# Patient Record
Sex: Female | Born: 1965 | Race: White | Hispanic: No | Marital: Married | Smoking: Never smoker
Health system: Southern US, Community
[De-identification: ages and names within clinical notes are randomized; demographics above are authoritative.]

## PROBLEM LIST (undated history)

## (undated) DIAGNOSIS — R519 Headache, unspecified: Secondary | ICD-10-CM

## (undated) DIAGNOSIS — T7840XA Allergy, unspecified, initial encounter: Secondary | ICD-10-CM

## (undated) DIAGNOSIS — F32A Depression, unspecified: Secondary | ICD-10-CM

## (undated) DIAGNOSIS — F419 Anxiety disorder, unspecified: Secondary | ICD-10-CM

## (undated) DIAGNOSIS — M79671 Pain in right foot: Secondary | ICD-10-CM

## (undated) DIAGNOSIS — H547 Unspecified visual loss: Secondary | ICD-10-CM

## (undated) DIAGNOSIS — M1990 Unspecified osteoarthritis, unspecified site: Secondary | ICD-10-CM

## (undated) DIAGNOSIS — R51 Headache: Secondary | ICD-10-CM

## (undated) DIAGNOSIS — J349 Unspecified disorder of nose and nasal sinuses: Secondary | ICD-10-CM

## (undated) DIAGNOSIS — F329 Major depressive disorder, single episode, unspecified: Secondary | ICD-10-CM

## (undated) DIAGNOSIS — K219 Gastro-esophageal reflux disease without esophagitis: Secondary | ICD-10-CM

## (undated) DIAGNOSIS — N189 Chronic kidney disease, unspecified: Secondary | ICD-10-CM

## (undated) DIAGNOSIS — E079 Disorder of thyroid, unspecified: Secondary | ICD-10-CM

## (undated) DIAGNOSIS — R7303 Prediabetes: Secondary | ICD-10-CM

## (undated) HISTORY — DX: Chronic kidney disease, unspecified: N18.9

## (undated) HISTORY — PX: TONSILLECTOMY AND ADENOIDECTOMY: SUR1326

## (undated) HISTORY — DX: Headache, unspecified: R51.9

## (undated) HISTORY — DX: Unspecified visual loss: H54.7

## (undated) HISTORY — DX: Unspecified disorder of nose and nasal sinuses: J34.9

## (undated) HISTORY — DX: Disorder of thyroid, unspecified: E07.9

## (undated) HISTORY — DX: Depression, unspecified: F32.A

## (undated) HISTORY — DX: Gastro-esophageal reflux disease without esophagitis: K21.9

## (undated) HISTORY — DX: Major depressive disorder, single episode, unspecified: F32.9

## (undated) HISTORY — DX: Anxiety disorder, unspecified: F41.9

## (undated) HISTORY — DX: Prediabetes: R73.03

## (undated) HISTORY — DX: Pain in right foot: M79.671

## (undated) HISTORY — DX: Allergy, unspecified, initial encounter: T78.40XA

## (undated) HISTORY — DX: Headache: R51

## (undated) HISTORY — DX: Unspecified osteoarthritis, unspecified site: M19.90

---

## 1998-11-16 HISTORY — PX: OTHER SURGICAL HISTORY: SHX169

## 2015-01-04 LAB — HEMOGLOBIN A1C: HEMOGLOBIN A1C: 5.6

## 2015-01-04 LAB — TSH: TSH: 0.01 — AB (ref 0.41–5.90)

## 2015-01-04 LAB — BASIC METABOLIC PANEL
GLUCOSE: 89
POTASSIUM: 4.5 (ref 3.4–5.3)
SODIUM: 142 (ref 137–147)

## 2015-01-04 LAB — VITAMIN D 25 HYDROXY (VIT D DEFICIENCY, FRACTURES): Vit D, 25-Hydroxy: 44

## 2015-01-04 LAB — HEPATIC FUNCTION PANEL
ALK PHOS: 32 (ref 25–125)
AST: 30 (ref 13–35)

## 2017-08-16 ENCOUNTER — Ambulatory Visit (INDEPENDENT_AMBULATORY_CARE_PROVIDER_SITE_OTHER): Payer: 59 | Admitting: Family Medicine

## 2017-08-16 ENCOUNTER — Encounter: Payer: Self-pay | Admitting: Family Medicine

## 2017-08-16 VITALS — BP 110/74 | HR 56 | Temp 98.1°F | Wt 177.6 lb

## 2017-08-16 DIAGNOSIS — E559 Vitamin D deficiency, unspecified: Secondary | ICD-10-CM | POA: Diagnosis not present

## 2017-08-16 DIAGNOSIS — R5382 Chronic fatigue, unspecified: Secondary | ICD-10-CM

## 2017-08-16 DIAGNOSIS — Z87898 Personal history of other specified conditions: Secondary | ICD-10-CM | POA: Diagnosis not present

## 2017-08-16 DIAGNOSIS — E039 Hypothyroidism, unspecified: Secondary | ICD-10-CM | POA: Insufficient documentation

## 2017-08-16 DIAGNOSIS — E538 Deficiency of other specified B group vitamins: Secondary | ICD-10-CM

## 2017-08-16 LAB — COMPREHENSIVE METABOLIC PANEL
ALT: 13 U/L (ref 0–35)
AST: 17 U/L (ref 0–37)
Albumin: 4.1 g/dL (ref 3.5–5.2)
Alkaline Phosphatase: 82 U/L (ref 39–117)
BUN: 11 mg/dL (ref 6–23)
CO2: 28 mEq/L (ref 19–32)
Calcium: 9.7 mg/dL (ref 8.4–10.5)
Chloride: 107 mEq/L (ref 96–112)
Creatinine, Ser: 0.93 mg/dL (ref 0.40–1.20)
GFR: 67.66 mL/min (ref >60.00)
Glucose, Bld: 100 mg/dL — ABNORMAL HIGH (ref 70–99)
Potassium: 4.4 mEq/L (ref 3.5–5.1)
Sodium: 145 mEq/L (ref 135–145)
Total Bilirubin: 0.4 mg/dL (ref 0.2–1.2)
Total Protein: 6.5 g/dL (ref 6.0–8.3)

## 2017-08-16 LAB — VITAMIN B12: Vitamin B-12: 536 pg/mL (ref 211–911)

## 2017-08-16 LAB — CBC WITH DIFFERENTIAL/PLATELET
Basophils Absolute: 0 10*3/uL (ref 0.0–0.1)
Basophils Relative: 0.8 % (ref 0.0–3.0)
Eosinophils Absolute: 0.2 10*3/uL (ref 0.0–0.7)
Eosinophils Relative: 3.7 % (ref 0.0–5.0)
HCT: 41 % (ref 36.0–46.0)
Hemoglobin: 13.4 g/dL (ref 12.0–15.0)
Lymphocytes Relative: 44.7 % (ref 12.0–46.0)
Lymphs Abs: 2.1 10*3/uL (ref 0.7–4.0)
MCHC: 32.6 g/dL (ref 30.0–36.0)
MCV: 94.1 fl (ref 78.0–100.0)
Monocytes Absolute: 0.4 10*3/uL (ref 0.1–1.0)
Monocytes Relative: 7.9 % (ref 3.0–12.0)
Neutro Abs: 2 10*3/uL (ref 1.4–7.7)
Neutrophils Relative %: 42.9 % — ABNORMAL LOW (ref 43.0–77.0)
Platelets: 289 10*3/uL (ref 150.0–400.0)
RBC: 4.36 Mil/uL (ref 3.87–5.11)
RDW: 13.5 % (ref 11.5–15.5)
WBC: 4.6 10*3/uL (ref 4.0–10.5)

## 2017-08-16 LAB — LIPID PANEL
Cholesterol: 205 mg/dL — ABNORMAL HIGH (ref 0–200)
HDL: 72.1 mg/dL (ref >39.00)
LDL Cholesterol: 118 mg/dL — ABNORMAL HIGH (ref 0–99)
NonHDL: 133.28
Total CHOL/HDL Ratio: 3
Triglycerides: 77 mg/dL (ref 0.0–149.0)
VLDL: 15.4 mg/dL (ref 0.0–40.0)

## 2017-08-16 LAB — FOLLICLE STIMULATING HORMONE: FSH: 78.8 m[IU]/mL

## 2017-08-16 LAB — VITAMIN D 25 HYDROXY (VIT D DEFICIENCY, FRACTURES): VITD: 42.99 ng/mL (ref 30.00–100.00)

## 2017-08-16 LAB — T4, FREE: Free T4: 1.27 ng/dL (ref 0.60–1.60)

## 2017-08-16 LAB — HEMOGLOBIN A1C: Hgb A1c MFr Bld: 5.9 % (ref 4.6–6.5)

## 2017-08-16 LAB — MAGNESIUM: Magnesium: 2.1 mg/dL (ref 1.5–2.5)

## 2017-08-16 LAB — LUTEINIZING HORMONE: LH: 39.35 m[IU]/mL

## 2017-08-16 LAB — TSH: TSH: 0.28 u[IU]/mL — ABNORMAL LOW (ref 0.35–4.50)

## 2017-08-16 NOTE — Progress Notes (Signed)
Caitlyn Page is a 51 y.o. female is here to [place].   Patient Care Team: Briscoe Deutscher, DO as PCP - General (Family Medicine)   History of Present Illness:   [name] CMA acting as scribe for Dr. Juleen China.  HPI:   1. Chronic fatigue. High stress job. Admits to "anxiety and depression tendencies." Does not exercise regularly. Recently started going to [facility] and got Health Coach. Wonders if she is perimenopausal.    2. Acquired hypothyroidism. Endocrine ROS: negative for - hair pattern changes, hot flashes, malaise/lethargy, mood swings, palpitations, skin changes, temperature intolerance or unexpected weight changes.    3. History of prediabetes. Endocrine ROS: negative for - polydipsia/polyuria.    4. Vitamin D deficiency. Taking vitamin D daily.    5. LE Pain: Hx of bilateral LE pain and numbness, worse after exercise.    Health Maintenance Due  Topic Date Due  . HIV Screening  01/07/1982  . TETANUS/TDAP  01/07/1986  . PAP SMEAR  01/08/1988  . MAMMOGRAM  01/07/2017  . COLONOSCOPY  01/07/2017   Depression screen PHQ 2/9 08/16/2017  Decreased Interest 1  Down, Depressed, Hopeless 1  PHQ - 2 Score 2  Altered sleeping 3  Tired, decreased energy 1  Change in appetite 1  Feeling bad or failure about yourself  1  Trouble concentrating 3  Moving slowly or fidgety/restless 2  Suicidal thoughts 0  PHQ-9 Score 13  Difficult doing work/chores Very difficult   PMHx, SurgHx, SocialHx, Medications, and Allergies were reviewed in the Visit Navigator and updated as appropriate.   Past Medical History:  Diagnosis Date  . Chronic kidney disease   . Depression   . Frequent headaches   . Thyroid disease     Past Surgical History:  Procedure Laterality Date  . TONSILLECTOMY AND ADENOIDECTOMY     Sometime in the 1980's   No family history on file.   Social History  Substance Use Topics  . Smoking status: Never Smoker  . Smokeless tobacco: Never Used    . Alcohol use No   Current Medications and Allergies:   Current Outpatient Prescriptions:  .  SYNTHROID 75 MCG tablet, Take 75 mcg by mouth daily., Disp: , Rfl: 0 .  Vitamin D, Ergocalciferol, (DRISDOL) 50000 units CAPS capsule, TAKE ONE CAPSULE BY MOUTH 2 TIMES A WEEK, Disp: , Rfl: 2  No Known Allergies   Review of Systems:   Pertinent items are noted in the HPI. Otherwise, ROS is negative.  Vitals:   Vitals:   08/16/17 0802  BP: 110/74  Pulse: (!) 56  Temp: 98.1 F (36.7 C)  TempSrc: Oral  SpO2: 98%  Weight: 177 lb 9.6 oz (80.6 kg)     There is no height or weight on file to calculate BMI.   Physical Exam:   Physical Exam  Constitutional: She is oriented to person, place, and time. She appears well-developed and well-nourished. No distress.  HENT:  Head: Normocephalic and atraumatic.  Right Ear: External ear normal.  Left Ear: External ear normal.  Nose: Nose normal.  Mouth/Throat: Oropharynx is clear and moist.  Eyes: Pupils are equal, round, and reactive to light. Conjunctivae and EOM are normal.  Neck: Normal range of motion. Neck supple. No thyromegaly present.  Cardiovascular: Normal rate, regular rhythm, normal heart sounds and intact distal pulses.   Pulmonary/Chest: Effort normal and breath sounds normal.  Abdominal: Soft. Bowel sounds are normal.  Musculoskeletal: Normal range of motion.  Lymphadenopathy:  She has no cervical adenopathy.  Neurological: She is alert and oriented to person, place, and time.  Skin: Skin is warm and dry. Capillary refill takes less than 2 seconds.  Psychiatric: She has a normal mood and affect. Her behavior is normal.  Nursing note and vitals reviewed.  Assessment and Plan:   Diagnoses and all orders for this visit:  Chronic fatigue -     CBC with Differential/Platelet -     Comprehensive metabolic panel -     T4, free -     TSH -     Hemoglobin A1c -     Magnesium -     Lipid panel -     Vitamin B12 -      VITAMIN D 25 Hydroxy (Vit-D Deficiency, Fractures) -     FSH -     LH -     T3, reverse  Acquired hypothyroidism -     T4, free -     TSH -     T3, reverse  History of prediabetes -     Comprehensive metabolic panel -     Hemoglobin A1c  Vitamin D deficiency -     VITAMIN D 25 Hydroxy (Vit-D Deficiency, Fractures)  B12 deficiency -     Vitamin B12   . Reviewed expectations re: course of current medical issues. . Discussed self-management of symptoms. . Outlined signs and symptoms indicating need for more acute intervention. . Patient verbalized understanding and all questions were answered. Marland Kitchen Health Maintenance issues including appropriate healthy diet, exercise, and smoking avoidance were discussed with patient. . See orders for this visit as documented in the electronic medical record. . Patient received an After Visit Summary.  CMA served as Education administrator during this visit. History, Physical, and Plan performed by medical provider. The above documentation has been reviewed and is accurate and complete. Briscoe Deutscher, D.O.  Briscoe Deutscher, DO [place] 08/16/2017  Future Appointments Date Time Provider [place]  11/22/2017 7:30 AM Briscoe Deutscher, DO [facility] None

## 2017-08-20 ENCOUNTER — Other Ambulatory Visit: Payer: Self-pay | Admitting: Family Medicine

## 2017-08-20 DIAGNOSIS — Z1231 Encounter for screening mammogram for malignant neoplasm of breast: Secondary | ICD-10-CM

## 2017-08-20 LAB — T3, REVERSE: T3, Reverse: 17 ng/dL (ref 8–25)

## 2017-08-20 MED ORDER — SYNTHROID 75 MCG PO TABS: 75.0000 ug | ORAL_TABLET | Freq: Every day | ORAL | 2 refills | Status: DC

## 2017-08-20 NOTE — Telephone Encounter (Signed)
Rx sent to requested pharmacy

## 2017-08-20 NOTE — Telephone Encounter (Signed)
MEDICATION:   SYNTHROID 75 MCG tablet    PHARMACY:   [id number] - [place], [place] (Phone) [phone] (Fax)    IS THIS A 90 DAY SUPPLY : yes  IS PATIENT OUT OF MEDICATION: yes  IF NOT; HOW MUCH IS LEFT:   LAST APPOINTMENT DATE: @10 /11/2016  NEXT APPOINTMENT DATE:@1 /05/2018  OTHER COMMENTS: Patient calling to check the status of lab results also.    **Let patient know to contact pharmacy at the end of the day to make sure medication is ready. **  ** Please notify patient to allow 48-72 hours to process**  **Encourage patient to contact the pharmacy for refills or they can request refills through [facility]**

## 2017-09-01 ENCOUNTER — Ambulatory Visit
Admission: RE | Admit: 2017-09-01 | Discharge: 2017-09-01 | Disposition: A | Discharge status: Home / Self Care | Payer: 59 | Source: Ambulatory Visit | Attending: Family Medicine | Admitting: Family Medicine

## 2017-09-01 DIAGNOSIS — Z1231 Encounter for screening mammogram for malignant neoplasm of breast: Secondary | ICD-10-CM

## 2017-09-02 ENCOUNTER — Encounter: Payer: Self-pay | Admitting: Family Medicine

## 2017-09-07 ENCOUNTER — Telehealth: Payer: Self-pay | Admitting: Family Medicine

## 2017-09-07 NOTE — Telephone Encounter (Signed)
[facility]  Patient Name: Caitlyn Page  DOB: 01/24/1966    Initial Comment Caller states c/o anxiety, left arm pain, insomnia and heart palpitations. She thinks it could be related to thyroid medication dose.    Nurse Assessment  Nurse: Hardin Negus, RN, Estill Bamberg Date/Time Eilene Ghazi Time): 09/07/2017 1:20:06 PM  Confirm and document reason for call. If symptomatic, describe symptoms. ---Caller states c/o anxiety, left arm pain, insomnia and heart palpitations for the past couple of days. She thinks it could be related to thyroid medication dose according to what her nurse told her after her blood work.  Does the patient have any new or worsening symptoms? ---Yes  Will a triage be completed? ---Yes  Related visit to physician within the last 2 weeks? ---No  Does the PT have any chronic conditions? (i.e. diabetes, asthma, etc.) ---No  Is the patient pregnant or possibly pregnant? (Ask all females between the ages of 8-55) ---No  Is this a behavioral health or substance abuse call? ---No     Guidelines    Guideline Title Affirmed Question Affirmed Notes  Chest Pain [1] Chest pain lasts > 5 minutes AND [2] age > 16    Final Disposition User   Call EMS 911 Now Hardin Negus, RN, Estill Bamberg    Comments  Patient states she does not think this is heart related as she knows her body. Patient states this happened before when she she was on too high dose of thyroid medication. Patient states she just want the doctor to write her a prescription for a lower dose. Transferred to office as meeting is over now.   Referrals  [facility] - ED  [facility] - [facility]   Caller Disagree/Comply Disagree  Caller Understands Yes  PreDisposition Call Doctor

## 2017-09-08 ENCOUNTER — Ambulatory Visit (INDEPENDENT_AMBULATORY_CARE_PROVIDER_SITE_OTHER): Payer: 59 | Admitting: Family Medicine

## 2017-09-08 ENCOUNTER — Encounter: Payer: Self-pay | Admitting: Family Medicine

## 2017-09-08 ENCOUNTER — Ambulatory Visit: Payer: 59 | Admitting: Family Medicine

## 2017-09-08 ENCOUNTER — Telehealth: Payer: Self-pay | Admitting: Family Medicine

## 2017-09-08 ENCOUNTER — Encounter: Payer: Self-pay | Admitting: Gastroenterology

## 2017-09-08 VITALS — BP 114/74 | HR 76 | Temp 98.5°F | Ht 64.0 in | Wt 181.8 lb

## 2017-09-08 DIAGNOSIS — R1013 Epigastric pain: Secondary | ICD-10-CM

## 2017-09-08 DIAGNOSIS — F419 Anxiety disorder, unspecified: Secondary | ICD-10-CM

## 2017-09-08 DIAGNOSIS — E039 Hypothyroidism, unspecified: Secondary | ICD-10-CM | POA: Diagnosis not present

## 2017-09-08 DIAGNOSIS — R0789 Other chest pain: Secondary | ICD-10-CM

## 2017-09-08 DIAGNOSIS — Z8711 Personal history of peptic ulcer disease: Secondary | ICD-10-CM | POA: Insufficient documentation

## 2017-09-08 DIAGNOSIS — Z0289 Encounter for other administrative examinations: Secondary | ICD-10-CM

## 2017-09-08 MED ORDER — OMEPRAZOLE 20 MG PO CPDR: 20.0000 mg | DELAYED_RELEASE_CAPSULE | Freq: Every day | ORAL | 3 refills | Status: DC

## 2017-09-08 MED ORDER — ALPRAZOLAM 1 MG PO TABS: 1.0000 mg | ORAL_TABLET | Freq: Two times a day (BID) | ORAL | 0 refills | Status: DC | PRN

## 2017-09-08 MED ORDER — LEVOTHYROXINE SODIUM 50 MCG PO TABS: 50.0000 ug | ORAL_TABLET | Freq: Every day | ORAL | 3 refills | Status: DC

## 2017-09-08 MED ORDER — SUCRALFATE 1 G PO TABS: ORAL_TABLET | ORAL | 0 refills | Status: DC

## 2017-09-08 NOTE — Telephone Encounter (Signed)
Patient came in 15 minutes late for scheduled appointment on 09/08/17. I informed patient of 10 minute late policy and that she would need to reschedule, patient understood and asked if I knew an "emergency clinic" she could go to. I checked with Leanna Battles stated Dr. Juleen China was in a room with a patient and she would check with her when she came out to see where Dr. Juleen China would recommend patient to go. I then informed patient that it would be a few minutes while Dr. Juleen China finished up with a patient and she would recommend where she could go. Patient stated she was not from the area and did not know where to go (while crying). I informed patient to have a seat and it would be a few minutes before Dr. Juleen China could inform her of where to go. While waiting, patient decided to leave.

## 2017-09-08 NOTE — Telephone Encounter (Signed)
Yes. Check on her. Okay to put her in a slot. Make sure she knows how much I'd like to help her but she has to make her appointment.

## 2017-09-08 NOTE — Telephone Encounter (Signed)
Would you like for me to call the patient.

## 2017-09-08 NOTE — Telephone Encounter (Signed)
Spoke with patient and she is leaving for a cruise in the morning. Per Dr. Juleen China we will see the patient at 1:45 today. I did advise the patient that if she was late there is nowhere else we can work her in at.

## 2017-09-08 NOTE — Progress Notes (Signed)
Caitlyn Page is a 51 y.o. female here for an acute visit.  History of Present Illness:   Water quality scientist, CMA, acting as scribe for Dr. Juleen China.  HPI:   1. Atypical chest pain. Onset was 2 days ago. Symptoms have improved since that time. The patient's pain is intermittent. The patient describes the pain as burning and radiates to the epigastrium. Patient rates pain as a 4/10 in intensity. Associated symptoms are: mild nausea and anxiety. Aggravating factors are: not eating. Alleviating factors are: eating. Patient's cardiac risk factors are: none. Patient's risk factors for DVT/PE: none. Previous cardiac testing: none.   2. Acquired hypothyroidism.   Lab Results  Component Value Date   TSH 0.28 (L) 08/16/2017   Endocrine ROS: positive for - hot flashes and mood swings.   3. Anxiety.  She has the following anxiety symptoms: chest pain, insomnia, racing thoughts. Onset of symptoms was approximately several days ago.  Symptoms have been unchanged since that time. She denies current suicidal and homicidal ideation. No drugs or ETOH.    PMHx, SurgHx, SocialHx, Medications, and Allergies were reviewed in the Visit Navigator and updated as appropriate.  Current Medications:   .  SYNTHROID 75 MCG tablet, Take 1 tablet (75 mcg total) by mouth daily., Disp: 90 tablet, Rfl: 2 .  Vitamin D, Ergocalciferol, (DRISDOL) 50000 units CAPS capsule, TAKE ONE CAPSULE BY MOUTH 2 TIMES A WEEK, Disp: , Rfl: 2   No Known Allergies   Review of Systems:   Pertinent items are noted in the HPI. Otherwise, ROS is negative.  Vitals:   Vitals:   09/08/17 1330  BP: 114/74  Pulse: 76  Temp: 98.5 F (36.9 C)  TempSrc: Oral  SpO2: 98%  Weight: 181 lb 12.8 oz (82.5 kg)  Height: 5\' 4"  (1.626 m)     Body mass index is 31.21 kg/m.   Physical Exam:   Physical Exam  Constitutional: She appears well-nourished.  HENT:  Head: Normocephalic and atraumatic.  Eyes: Pupils are equal, round, and reactive to  light. EOM are normal.  Neck: Normal range of motion. Neck supple.  Cardiovascular: Normal rate, regular rhythm, normal heart sounds and intact distal pulses.   Pulmonary/Chest: Effort normal.  Abdominal: Soft.  Skin: Skin is warm.  Psychiatric: She has a normal mood and affect. Her behavior is normal.  Nursing note and vitals reviewed.  Results for orders placed or performed in visit on 09/02/17  VITAMIN D 25 Hydroxy (Vit-D Deficiency, Fractures)  Result Value Ref Range   Vit D, 25-Hydroxy 44   Basic metabolic panel  Result Value Ref Range   Glucose 89    Potassium 4.5 3.4 - 5.3   Sodium 142 137 - 147  Hepatic function panel  Result Value Ref Range   Alkaline Phosphatase 32 25 - 125   AST 30 13 - 35  Hemoglobin A1c  Result Value Ref Range   Hemoglobin A1C 5.6   TSH  Result Value Ref Range   TSH 0.01 (A) 0.41 - 5.90   EKG: normal sinus rhythm.  Assessment and Plan:   Caitlyn Page was seen today for acute visit.  Diagnoses and all orders for this visit:  Atypical chest pain Comments: Likely due to GI cause. No red flags for cardiac or pulmonary issues.  Orders: -     EKG 12-Lead  Acquired hypothyroidism Comments: Decreasing Synthroid. Recheck in 6 weeks.  Orders: -     levothyroxine (SYNTHROID, LEVOTHROID) 50 MCG tablet; Take 1 tablet (50  mcg total) by mouth daily.  Dyspepsia Comments: Concern for recurrence of PUD. No red flags today. Diet precuations reviewed.  Orders: -     sucralfate (CARAFATE) 1 g tablet; Four times daily with meals and at bedtime -     omeprazole (PRILOSEC) 20 MG capsule; Take 1 capsule (20 mg total) by mouth daily. -     Ambulatory referral to Gastroenterology  Anxiety Comments: PRN medications as below.  Orders: -     ALPRAZolam (XANAX) 1 MG tablet; Take 1 tablet (1 mg total) by mouth 2 (two) times daily as needed for anxiety.   . Reviewed expectations re: course of current medical issues. . Discussed self-management of  symptoms. . Outlined signs and symptoms indicating need for more acute intervention. . Patient verbalized understanding and all questions were answered. Caitlyn Page Health Maintenance issues including appropriate healthy diet, exercise, and smoking avoidance were discussed with patient. . See orders for this visit as documented in the electronic medical record. . Patient received an After Visit Summary.  CMA served as Education administrator during this visit. History, Physical, and Plan performed by medical provider. The above documentation has been reviewed and is accurate and complete. Briscoe Deutscher, D.O.  Briscoe Deutscher, DO [place] 09/10/2017  Future Appointments Date Time Provider [place]  10/12/2017 8:45 AM [facility] LAB [facility] None  10/12/2017 9:00 AM Briscoe Deutscher, DO [facility] None  10/18/2017 2:30 PM Ladene Artist, MD [facility]  11/22/2017 7:40 AM Briscoe Deutscher, DO [facility] None

## 2017-10-11 ENCOUNTER — Other Ambulatory Visit: Payer: Self-pay | Admitting: Surgical

## 2017-10-11 ENCOUNTER — Telehealth: Payer: Self-pay

## 2017-10-11 DIAGNOSIS — R5383 Other fatigue: Secondary | ICD-10-CM

## 2017-10-11 NOTE — Telephone Encounter (Signed)
Pt on lab schedule 10/12/17 with no orders. Please place future orders. Thank you.  

## 2017-10-11 NOTE — Telephone Encounter (Signed)
I have ordered them.

## 2017-10-12 ENCOUNTER — Ambulatory Visit: Payer: 59 | Admitting: Family Medicine

## 2017-10-12 ENCOUNTER — Telehealth: Payer: Self-pay

## 2017-10-12 ENCOUNTER — Other Ambulatory Visit: Payer: Managed Care, Other (non HMO)

## 2017-10-12 ENCOUNTER — Other Ambulatory Visit: Payer: Self-pay

## 2017-10-12 ENCOUNTER — Other Ambulatory Visit: Payer: Self-pay | Admitting: Radiology

## 2017-10-12 VITALS — BP 114/72 | HR 74 | Temp 97.9°F | Wt 187.8 lb

## 2017-10-12 DIAGNOSIS — R1013 Epigastric pain: Secondary | ICD-10-CM

## 2017-10-12 DIAGNOSIS — E039 Hypothyroidism, unspecified: Secondary | ICD-10-CM

## 2017-10-12 DIAGNOSIS — E538 Deficiency of other specified B group vitamins: Secondary | ICD-10-CM

## 2017-10-12 DIAGNOSIS — B370 Candidal stomatitis: Secondary | ICD-10-CM | POA: Diagnosis not present

## 2017-10-12 DIAGNOSIS — E8881 Metabolic syndrome: Secondary | ICD-10-CM | POA: Diagnosis not present

## 2017-10-12 DIAGNOSIS — R5383 Other fatigue: Secondary | ICD-10-CM

## 2017-10-12 DIAGNOSIS — E88819 Insulin resistance, unspecified: Secondary | ICD-10-CM | POA: Insufficient documentation

## 2017-10-12 DIAGNOSIS — K279 Peptic ulcer, site unspecified, unspecified as acute or chronic, without hemorrhage or perforation: Secondary | ICD-10-CM | POA: Diagnosis not present

## 2017-10-12 DIAGNOSIS — F419 Anxiety disorder, unspecified: Secondary | ICD-10-CM | POA: Insufficient documentation

## 2017-10-12 LAB — COMPREHENSIVE METABOLIC PANEL
ALT: 17 U/L (ref 0–35)
AST: 21 U/L (ref 0–37)
Albumin: 4 g/dL (ref 3.5–5.2)
Alkaline Phosphatase: 80 U/L (ref 39–117)
BUN: 13 mg/dL (ref 6–23)
CO2: 28 mEq/L (ref 19–32)
Calcium: 9.3 mg/dL (ref 8.4–10.5)
Chloride: 104 mEq/L (ref 96–112)
Creatinine, Ser: 1 mg/dL (ref 0.40–1.20)
GFR: 62.19 mL/min (ref >60.00)
Glucose, Bld: 99 mg/dL (ref 70–99)
Potassium: 4.6 mEq/L (ref 3.5–5.1)
Sodium: 140 mEq/L (ref 135–145)
Total Bilirubin: 0.5 mg/dL (ref 0.2–1.2)
Total Protein: 6.6 g/dL (ref 6.0–8.3)

## 2017-10-12 LAB — HEMOGLOBIN A1C: Hgb A1c MFr Bld: 6 % (ref 4.6–6.5)

## 2017-10-12 LAB — T4, FREE: Free T4: 0.91 ng/dL (ref 0.60–1.60)

## 2017-10-12 LAB — TSH: TSH: 2.28 u[IU]/mL (ref 0.35–4.50)

## 2017-10-12 MED ORDER — CYANOCOBALAMIN 1000 MCG/ML IJ SOLN
1000.0000 ug | Freq: Once | INTRAMUSCULAR | Status: AC
  Administered 2017-10-12: 1000 ug via INTRAMUSCULAR

## 2017-10-12 MED ORDER — FLUCONAZOLE 100 MG PO TABS: 100.0000 mg | ORAL_TABLET | Freq: Every day | ORAL | 0 refills | Status: DC

## 2017-10-12 NOTE — Addendum Note (Signed)
Addended by: Kayren Eaves T on: 10/12/2017 08:57 AM   Modules accepted: Orders

## 2017-10-12 NOTE — Telephone Encounter (Signed)
Diflucan will work.

## 2017-10-12 NOTE — Telephone Encounter (Signed)
Patient asked while giving discharge information.  Patient thinks she has thrush she is having some soreness in the mouth that comes and goes with white patches. She wanted to know if the Diflucan would treat. Informed her I would send you message you are with another patient and give her a call. She does not want mouth wash due to added sugar.

## 2017-10-12 NOTE — Telephone Encounter (Signed)
Called patient no questions at this time.

## 2017-10-12 NOTE — Progress Notes (Addendum)
Caitlyn Page is a 51 y.o. female is here for follow up.  History of Present Illness:   HPI:   1. Peptic ulcer disease. Referred to GI at last visit for dyspepsia. She feels that this has improved. Not taking medications.    2. Insulin resistance. Last A1c below. She feels that she has been hungrier for carbohydrates lately. Worried about weight gain.  Lab Results  Component Value Date   HGBA1C 5.9 08/16/2017     3. Acquired hypothyroidism. Over-treated at last visit and dose decreased. Due for recheck of TSH, free T4. Feels better since the decrease.   4. Anxiety. Atypical CP evaluation at last visit diagnosed as panic attack. Rx prn Xanax. Only needed one dose. No other episodes.     Review of Systems  Constitutional: Negative for chills, fever, malaise/fatigue and weight loss.  Respiratory: Negative for cough, shortness of breath and wheezing.   Cardiovascular: Negative for chest pain, palpitations and leg swelling.  Gastrointestinal: Negative for abdominal pain, constipation, diarrhea, nausea and vomiting.  Genitourinary: Negative for dysuria and urgency.  Musculoskeletal: Negative for joint pain and myalgias.  Skin: Positive for rash.  Neurological: Negative for dizziness and headaches.  Psychiatric/Behavioral: Negative for depression, substance abuse and suicidal ideas. The patient is not nervous/anxious.       Health Maintenance Due  Topic Date Due  . PAP SMEAR  01/08/1988  . COLONOSCOPY  01/07/2017   Depression screen PHQ 2/9 08/16/2017  Decreased Interest 1  Down, Depressed, Hopeless 1  PHQ - 2 Score 2  Altered sleeping 3  Tired, decreased energy 1  Change in appetite 1  Feeling bad or failure about yourself  1  Trouble concentrating 3  Moving slowly or fidgety/restless 2  Suicidal thoughts 0  PHQ-9 Score 13  Difficult doing work/chores Very difficult   PMHx, SurgHx, SocialHx, FamHx, Medications, and Allergies were reviewed in the Visit Navigator and  updated as appropriate.   Patient Active Problem List   Diagnosis Date Noted  . Insulin resistance 10/12/2017  . Dyspepsia 10/12/2017  . Anxiety 10/12/2017  . Peptic ulcer disease 09/08/2017  . Hypothyroidism 08/16/2017   Social History   Tobacco Use  . Smoking status: Never Smoker  . Smokeless tobacco: Never Used  Substance Use Topics  . Alcohol use: No  . Drug use: No   Current Medications and Allergies:   .  ALPRAZolam (XANAX) 1 MG tablet, Take 1 tablet (1 mg total) by mouth 2 (two) times daily as needed for anxiety., Disp: 30 tablet .  SYNTHROID 75 MCG tablet, Take 50 mcg by mouth daily., Disp: , Rfl: 0 .  Vitamin D, Ergocalciferol, (DRISDOL) 50000 units CAPS capsule, TAKE ONE CAPSULE BY MOUTH 2 TIMES A WEEK, Disp: , Rfl: 2  No Known Allergies   Review of Systems   Pertinent items are noted in the HPI. Otherwise, ROS is negative.  Vitals:   Vitals:   10/12/17 0900  BP: 114/72  Pulse: 74  Temp: 97.9 F (36.6 C)  TempSrc: Oral  SpO2: 98%  Weight: 187 lb 12.8 oz (85.2 kg)     Body mass index is 32.24 kg/m.   Physical Exam:   Physical Exam  Constitutional: She appears well-nourished.  HENT:  Head: Normocephalic and atraumatic.  Eyes: EOM are normal. Pupils are equal, round, and reactive to light.  Neck: Normal range of motion. Neck supple.  Cardiovascular: Normal rate, regular rhythm, normal heart sounds and intact distal pulses.  Pulmonary/Chest: Effort normal.  Abdominal: Soft.  Skin: Skin is warm.  Thrush.  Psychiatric: She has a normal mood and affect. Her behavior is normal.  Nursing note and vitals reviewed.   Results for orders placed or performed in visit on 09/02/17  VITAMIN D 25 Hydroxy (Vit-D Deficiency, Fractures)  Result Value Ref Range   Vit D, 25-Hydroxy 44   Basic metabolic panel  Result Value Ref Range   Glucose 89    Potassium 4.5 3.4 - 5.3   Sodium 142 137 - 147  Hepatic function panel  Result Value Ref Range   Alkaline  Phosphatase 32 25 - 125   AST 30 13 - 35  Hemoglobin A1c  Result Value Ref Range   Hemoglobin A1C 5.6   TSH  Result Value Ref Range   TSH 0.01 (A) 0.41 - 5.90   Assessment and Plan:   Caitlyn Page was seen today for follow-up.  Diagnoses and all orders for this visit:  Peptic ulcer disease Comments: Improved. Will cancel GI referral per patient for now.   Insulin resistance Comments: The patient is asked to make an attempt to improve diet and exercise patterns to aid in medical management of this problem.  Orders: -     Comprehensive metabolic panel -     Hemoglobin A1c  Acquired hypothyroidism Comments: Due for recheck.  Orders: -     Comprehensive metabolic panel -     T4, free -     TSH  Dyspepsia Comments: Resolved.  Orders: -     Comprehensive metabolic panel  Anxiety Comments: No new issues. She feels more calm. Continues meditation.   B12 deficiency -     cyanocobalamin ((VITAMIN B-12)) injection 1,000 mcg  Thrush Comments: + mild intertigo. Will treat as below. Orders: -     fluconazole (DIFLUCAN) 100 MG tablet; Take 1 tablet (100 mg total) by mouth daily.    . Reviewed expectations re: course of current medical issues. . Discussed self-management of symptoms. . Outlined signs and symptoms indicating need for more acute intervention. . Patient verbalized understanding and all questions were answered. Marland Kitchen Health Maintenance issues including appropriate healthy diet, exercise, and smoking avoidance were discussed with patient. . See orders for this visit as documented in the electronic medical record. . Patient received an After Visit Summary.  Briscoe Deutscher, DO [place] 10/13/2017  Future Appointments  Date Time Provider [place]  10/18/2017  2:30 PM Ladene Artist, MD [facility]  10/22/2017  9:20 AM Briscoe Deutscher, DO [facility]  11/22/2017  7:40 AM Briscoe Deutscher, DO [facility]

## 2017-10-13 ENCOUNTER — Encounter: Payer: Self-pay | Admitting: Family Medicine

## 2017-10-18 ENCOUNTER — Ambulatory Visit: Payer: 59 | Admitting: Gastroenterology

## 2017-10-22 ENCOUNTER — Other Ambulatory Visit (HOSPITAL_COMMUNITY)
Admission: RE | Admit: 2017-10-22 | Discharge: 2017-10-22 | Disposition: A | Discharge status: Home / Self Care | Payer: Managed Care, Other (non HMO) | Source: Ambulatory Visit | Attending: Family Medicine | Admitting: Family Medicine

## 2017-10-22 ENCOUNTER — Encounter: Payer: Self-pay | Admitting: Family Medicine

## 2017-10-22 ENCOUNTER — Ambulatory Visit (INDEPENDENT_AMBULATORY_CARE_PROVIDER_SITE_OTHER): Payer: Managed Care, Other (non HMO) | Admitting: Family Medicine

## 2017-10-22 ENCOUNTER — Ambulatory Visit: Payer: Managed Care, Other (non HMO) | Admitting: Family Medicine

## 2017-10-22 VITALS — BP 110/76 | HR 63 | Temp 97.6°F | Wt 181.0 lb

## 2017-10-22 DIAGNOSIS — Z0000 Encounter for general adult medical examination without abnormal findings: Secondary | ICD-10-CM

## 2017-10-22 DIAGNOSIS — Z124 Encounter for screening for malignant neoplasm of cervix: Secondary | ICD-10-CM | POA: Diagnosis not present

## 2017-10-22 NOTE — Progress Notes (Signed)
Subjective:    Caitlyn Page is a 51 y.o. female and is here for a comprehensive physical exam.  Pertinent Gynecological History: No LMP recorded. Patient is postmenopausal.  Health Maintenance Due  Topic Date Due  . PAP SMEAR  01/08/1988  . COLONOSCOPY  01/07/2017   PMHx, SurgHx, SocialHx, Medications, and Allergies were reviewed in the Visit Navigator and updated as appropriate.   Past Medical History:  Diagnosis Date  . Chronic kidney disease   . Depression   . Frequent headaches   . Thyroid disease    Past Surgical History:  Procedure Laterality Date  . TONSILLECTOMY AND ADENOIDECTOMY     Sometime in the 1980's   Family History  Problem Relation Age of Onset  . Breast cancer Mother    Social History   Tobacco Use  . Smoking status: Never Smoker  . Smokeless tobacco: Never Used  Substance Use Topics  . Alcohol use: No  . Drug use: No   Review of Systems:   Pertinent items are noted in the HPI. Otherwise, ROS is negative.  Objective:   BP 110/76   Pulse 63   Temp 97.6 F (36.4 C) (Oral)   Wt 181 lb (82.1 kg)   SpO2 96%   BMI 31.07 kg/m    Wt Readings from Last 3 Encounters:  10/22/17 181 lb (82.1 kg)  10/12/17 187 lb 12.8 oz (85.2 kg)  09/08/17 181 lb 12.8 oz (82.5 kg)     Ht Readings from Last 3 Encounters:  09/08/17 5\' 4"  (1.626 m)   General appearance: alert, cooperative and appears stated age. Head: normocephalic, without obvious abnormality, atraumatic. Neck: no adenopathy, supple, symmetrical, trachea midline; thyroid not enlarged, symmetric, no tenderness/mass/nodules. Lungs: clear to auscultation bilaterally. Heart: regular rate and rhythm Abdomen: soft, non-tender; no masses,  no organomegaly. Extremities: extremities normal, atraumatic, no cyanosis or edema. Skin: skin color, texture, turgor normal, no rashes or lesions. Lymph: cervical, supraclavicular, and axillary nodes normal; no abnormal inguinal nodes palpated. Neurologic:  grossly normal.  Pelvic:  External genitalia: no lesions.              Urethra: normal appearing urethra with no masses, tenderness or lesions.              Bartholins and Skenes: normal.               Vagina: normal appearing vagina with normal color and discharge, no lesions.              Cervix: normal appearance.              Pap and high risk HPV testing done: Yes.          Bimanual Exam:    Uterus: uterus is normal size, shape, consistency and nontender.                                      Adnexa: normal adnexa in size, nontender and no masses.                                    Assessment/Plan:   Caitlyn Page was seen today for annual exam.  Diagnoses and all orders for this visit:  Routine physical examination  Pap smear for cervical cancer screening -     Cytology - PAP ([place])  Patient Counseling: [x]    Nutrition: Stressed importance of moderation in sodium/caffeine intake, saturated fat and cholesterol, caloric balance, sufficient intake of fresh fruits, vegetables, fiber, calcium, iron, and 1 mg of folate supplement per day (for females capable of pregnancy).  [x]    Stressed the importance of regular exercise.   [x]    Substance Abuse: Discussed cessation/primary prevention of tobacco, alcohol, or other drug use; driving or other dangerous activities under the influence; availability of treatment for abuse.   [x]    Injury prevention: Discussed safety belts, safety helmets, smoke detector, smoking near bedding or upholstery.   [x]    Sexuality: Discussed sexually transmitted diseases, partner selection, use of condoms, avoidance of unintended pregnancy  and contraceptive alternatives.  [x]    Dental health: Discussed importance of regular tooth brushing, flossing, and dental visits.  [x]    Health maintenance and immunizations reviewed. Please refer to Health maintenance section.   Briscoe Deutscher, DO [place]

## 2017-10-22 NOTE — Addendum Note (Signed)
Addended by: Frutoso Chase A on: 10/22/2017 10:00 AM   Modules accepted: Orders

## 2017-10-22 NOTE — Patient Instructions (Addendum)

## 2017-10-26 LAB — CYTOLOGY - PAP
Diagnosis: NEGATIVE
HPV: NOT DETECTED

## 2017-11-22 ENCOUNTER — Ambulatory Visit: Payer: 59 | Admitting: Family Medicine

## 2017-12-10 ENCOUNTER — Ambulatory Visit: Payer: Managed Care, Other (non HMO) | Admitting: Family Medicine

## 2017-12-10 ENCOUNTER — Encounter: Payer: Self-pay | Admitting: Family Medicine

## 2017-12-10 VITALS — BP 118/76 | HR 73 | Temp 98.6°F | Wt 181.0 lb

## 2017-12-10 DIAGNOSIS — E8881 Metabolic syndrome: Secondary | ICD-10-CM

## 2017-12-10 DIAGNOSIS — E039 Hypothyroidism, unspecified: Secondary | ICD-10-CM | POA: Diagnosis not present

## 2017-12-10 NOTE — Progress Notes (Signed)
Caitlyn Page is a 52 y.o. female is here for follow up.  History of Present Illness:   HPI: See Assessment and Plan section for Problem Based Charting of issues discussed today.   There are no preventive care reminders to display for this patient. Depression screen [facility] 2/9 12/10/2017 08/16/2017  Decreased Interest 0 1  Down, Depressed, Hopeless 0 1  PHQ - 2 Score 0 2  Altered sleeping 0 3  Tired, decreased energy 0 1  Change in appetite 0 1  Feeling bad or failure about yourself  0 1  Trouble concentrating 0 3  Moving slowly or fidgety/restless 0 2  Suicidal thoughts 0 0  PHQ-9 Score 0 13  Difficult doing work/chores - Very difficult   PMHx, SurgHx, SocialHx, FamHx, Medications, and Allergies were reviewed in the Visit Navigator and updated as appropriate.   Patient Active Problem List   Diagnosis Date Noted  . Insulin resistance 10/12/2017  . Anxiety 10/12/2017  . History of peptic ulcer disease 09/08/2017  . Hypothyroidism 08/16/2017   Social History   Tobacco Use  . Smoking status: Never Smoker  . Smokeless tobacco: Never Used  Substance Use Topics  . Alcohol use: No  . Drug use: No   Current Medications and Allergies:   Current Outpatient Medications:  .  SYNTHROID 50 MCG tablet, Take 50 mg by mouth daily., Disp: , Rfl: 0  No Known Allergies   Review of Systems   Pertinent items are noted in the HPI. Otherwise, ROS is negative.  Vitals:   Vitals:   12/10/17 0843  BP: 118/76  Pulse: 73  Temp: 98.6 F (37 C)  TempSrc: Oral  SpO2: 97%  Weight: 181 lb (82.1 kg)     Body mass index is 31.07 kg/m.   Physical Exam:   Physical Exam  Constitutional: She is oriented to person, place, and time. She appears well-developed and well-nourished. No distress.  HENT:  Head: Normocephalic and atraumatic.  Right Ear: External ear normal.  Left Ear: External ear normal.  Nose: Nose normal.  Mouth/Throat: Oropharynx is clear and moist.  Eyes: Conjunctivae  and EOM are normal. Pupils are equal, round, and reactive to light.  Neck: Normal range of motion. Neck supple. No thyromegaly present.  Cardiovascular: Normal rate, regular rhythm, normal heart sounds and intact distal pulses.  Pulmonary/Chest: Effort normal and breath sounds normal.  Abdominal: Soft. Bowel sounds are normal.  Musculoskeletal: Normal range of motion.  Lymphadenopathy:    She has no cervical adenopathy.  Neurological: She is alert and oriented to person, place, and time.  Skin: Skin is warm and dry. Capillary refill takes less than 2 seconds.  Psychiatric: She has a normal mood and affect. Her behavior is normal.  Nursing note and vitals reviewed.  Assessment and Plan:   Caitlyn Page was seen today for follow-up.  Diagnoses and all orders for this visit:  Acquired hypothyroidism Comments: Caitlyn Page is a 52 y.o. female  is seen in [place] for hypothyroidism. Current symptoms: none. Patient denies change in energy level, diarrhea, heat / cold intolerance, nervousness, palpitations and weight changes. Symptoms have stabilized.   Orders: -     T4, free; Future -     TSH; Future  Insulin resistance Comments: The patient is asked to make an attempt to improve diet and exercise patterns to aid in medical management of this problem.  Orders: -     Hemoglobin A1c; Future   . Reviewed expectations re: course of current medical  issues. . Discussed self-management of symptoms. . Outlined signs and symptoms indicating need for more acute intervention. . Patient verbalized understanding and all questions were answered. Marland Kitchen Health Maintenance issues including appropriate healthy diet, exercise, and smoking avoidance were discussed with patient. . See orders for this visit as documented in the electronic medical record. . Patient received an After Visit Summary.  Briscoe Deutscher, DO [place], [place] 12/10/2017  Future Appointments  Date Time Provider [place]    02/07/2018  8:30 AM [facility]  03/10/2018  8:40 AM Briscoe Deutscher, DO [facility]

## 2018-01-18 ENCOUNTER — Ambulatory Visit (INDEPENDENT_AMBULATORY_CARE_PROVIDER_SITE_OTHER): Payer: Managed Care, Other (non HMO) | Admitting: Family Medicine

## 2018-01-18 ENCOUNTER — Ambulatory Visit: Payer: Managed Care, Other (non HMO) | Admitting: Family Medicine

## 2018-01-18 ENCOUNTER — Encounter: Payer: Self-pay | Admitting: Family Medicine

## 2018-01-18 VITALS — BP 110/72 | HR 71 | Wt 175.6 lb

## 2018-01-18 DIAGNOSIS — E669 Obesity, unspecified: Secondary | ICD-10-CM

## 2018-01-18 NOTE — Progress Notes (Signed)
Patient comes in today for a weight check. She is doing very well and has lost 6 pounds since last visit.

## 2018-02-07 ENCOUNTER — Other Ambulatory Visit (INDEPENDENT_AMBULATORY_CARE_PROVIDER_SITE_OTHER): Payer: Managed Care, Other (non HMO)

## 2018-02-07 DIAGNOSIS — E8881 Metabolic syndrome: Secondary | ICD-10-CM

## 2018-02-07 DIAGNOSIS — E039 Hypothyroidism, unspecified: Secondary | ICD-10-CM

## 2018-02-07 LAB — TSH: TSH: 2.06 u[IU]/mL (ref 0.35–4.50)

## 2018-02-07 LAB — HEMOGLOBIN A1C: Hgb A1c MFr Bld: 5.9 % (ref 4.6–6.5)

## 2018-02-07 LAB — T4, FREE: Free T4: 0.86 ng/dL (ref 0.60–1.60)

## 2018-03-10 ENCOUNTER — Ambulatory Visit: Payer: Managed Care, Other (non HMO) | Admitting: Family Medicine

## 2018-03-11 ENCOUNTER — Ambulatory Visit (INDEPENDENT_AMBULATORY_CARE_PROVIDER_SITE_OTHER): Payer: Managed Care, Other (non HMO) | Admitting: Family Medicine

## 2018-03-11 ENCOUNTER — Other Ambulatory Visit (HOSPITAL_COMMUNITY)
Admission: RE | Admit: 2018-03-11 | Discharge: 2018-03-11 | Disposition: A | Discharge status: Home / Self Care | Payer: Managed Care, Other (non HMO) | Source: Ambulatory Visit | Attending: Family Medicine | Admitting: Family Medicine

## 2018-03-11 ENCOUNTER — Encounter: Payer: Self-pay | Admitting: Family Medicine

## 2018-03-11 VITALS — BP 118/76 | HR 71 | Temp 97.6°F | Ht 64.0 in | Wt 178.4 lb

## 2018-03-11 DIAGNOSIS — Z8711 Personal history of peptic ulcer disease: Secondary | ICD-10-CM

## 2018-03-11 DIAGNOSIS — E8881 Metabolic syndrome: Secondary | ICD-10-CM | POA: Diagnosis not present

## 2018-03-11 DIAGNOSIS — R1013 Epigastric pain: Secondary | ICD-10-CM

## 2018-03-11 DIAGNOSIS — Z79890 Hormone replacement therapy (postmenopausal): Secondary | ICD-10-CM | POA: Insufficient documentation

## 2018-03-11 DIAGNOSIS — N952 Postmenopausal atrophic vaginitis: Secondary | ICD-10-CM | POA: Insufficient documentation

## 2018-03-11 DIAGNOSIS — E039 Hypothyroidism, unspecified: Secondary | ICD-10-CM | POA: Insufficient documentation

## 2018-03-11 DIAGNOSIS — E88819 Insulin resistance, unspecified: Secondary | ICD-10-CM

## 2018-03-11 MED ORDER — LEVOTHYROXINE SODIUM 75 MCG PO TABS: 75.0000 ug | ORAL_TABLET | Freq: Every day | ORAL | 1 refills | Status: DC

## 2018-03-11 MED ORDER — ESTRADIOL 0.1 MG/GM VA CREA: TOPICAL_CREAM | VAGINAL | 0 refills | Status: DC

## 2018-03-11 MED ORDER — ESOMEPRAZOLE MAGNESIUM 40 MG PO PACK: 40.0000 mg | PACK | Freq: Every day | ORAL | 12 refills | Status: DC

## 2018-03-11 NOTE — Progress Notes (Signed)
Caitlyn Page is a 52 y.o. female is here for follow up.  History of Present Illness:   [name], CMA acting as scribe for Dr. Briscoe Deutscher.   HPI: Patient in for follow up.  Shoulder pain: Left shoulder pain for about three weeks. Pain does not change with movement. She has not tried any over the counter medications. She denies any injury. Pain level is 3/10 with no change. She did have massage.   Ulcer: Patient states she had ulcers in the past. About a week ago she started having some abdominal pain and nasua that lasted about a week. It improved after she tried some home remedies. She has not had any problems with it in over a week.  Vaginal Discharge: Patient was seen on 02/19/09 at an urgent care and told she had bacterial vaginosis. She was given Metronidazole. She did not take treatment. She did not feel confident in the provider. She is still having discharge and burning.      There are no preventive care reminders to display for this patient. Depression screen [facility] 2/9 03/11/2018 12/10/2017 08/16/2017  Decreased Interest 0 0 1  Down, Depressed, Hopeless 1 0 1  PHQ - 2 Score 1 0 2  Altered sleeping 0 0 3  Tired, decreased energy 0 0 1  Change in appetite 1 0 1  Feeling bad or failure about yourself  0 0 1  Trouble concentrating 1 0 3  Moving slowly or fidgety/restless 0 0 2  Suicidal thoughts 0 0 0  PHQ-9 Score 3 0 13  Difficult doing work/chores - - Very difficult   PMHx, SurgHx, SocialHx, FamHx, Medications, and Allergies were reviewed in the Visit Navigator and updated as appropriate.   Patient Active Problem List   Diagnosis Date Noted  . Insulin resistance 10/12/2017  . Anxiety 10/12/2017  . History of peptic ulcer disease 09/08/2017  . Hypothyroidism 08/16/2017   Social History   Tobacco Use  . Smoking status: Never Smoker  . Smokeless tobacco: Never Used  Substance Use Topics  . Alcohol use: No  . Drug use: No   Current Medications and Allergies:     Current Outpatient Medications:  .  SYNTHROID 50 MCG tablet, Take 50 mg by mouth daily., Disp: , Rfl: 0  No Known Allergies   Review of Systems   Pertinent items are noted in the HPI. Otherwise, ROS is negative.  Vitals:   Vitals:   03/11/18 0749  BP: 118/76  Pulse: 71  Temp: 97.6 F (36.4 C)  TempSrc: Oral  SpO2: 96%  Weight: 178 lb 6.4 oz (80.9 kg)  Height: 5\' 4"  (1.626 m)     Body mass index is 30.62 kg/m.   Physical Exam:   Physical Exam  Constitutional: She is oriented to person, place, and time. She appears well-developed and well-nourished. No distress.  HENT:  Head: Normocephalic and atraumatic.  Right Ear: External ear normal.  Left Ear: External ear normal.  Nose: Nose normal.  Mouth/Throat: Oropharynx is clear and moist.  Eyes: Pupils are equal, round, and reactive to light. Conjunctivae and EOM are normal.  Neck: Normal range of motion. Neck supple. No thyromegaly present.  Cardiovascular: Normal rate, regular rhythm, normal heart sounds and intact distal pulses.  Pulmonary/Chest: Effort normal and breath sounds normal.  Abdominal: Soft. Bowel sounds are normal.  Genitourinary:  Genitourinary Comments: No vaginal DC. Atrophic changes.   Musculoskeletal: Normal range of motion.       Left shoulder: She  exhibits bony tenderness. She exhibits normal range of motion.       Arms: Lymphadenopathy:    She has no cervical adenopathy.  Neurological: She is alert and oriented to person, place, and time.  Skin: Skin is warm and dry. Capillary refill takes less than 2 seconds.  Psychiatric: She has a normal mood and affect. Her behavior is normal.  Nursing note and vitals reviewed.    Results for orders placed or performed in visit on 02/07/18  TSH  Result Value Ref Range   TSH 2.06 0.35 - 4.50 uIU/mL  T4, free  Result Value Ref Range   Free T4 0.86 0.60 - 1.60 ng/dL  Hemoglobin A1c  Result Value Ref Range   Hgb A1c MFr Bld 5.9 4.6 - 6.5 %     Assessment and Plan:   Briggette was seen today for follow-up.  Diagnoses and all orders for this visit:  Acquired hypothyroidism Comments: Okay to try 50, 50, 75 regimen.  Orders: -     levothyroxine (SYNTHROID, LEVOTHROID) 75 MCG tablet; Take 1 tablet (75 mcg total) by mouth daily. -     T4, free; Future -     TSH; Future  Atrophic vaginitis -     estradiol (ESTRACE) 0.1 MG/GM vaginal cream; 0.5 g intravaginally 1-3 times per week. -     Cervicovaginal ancillary only  Insulin resistance Comments: Patient will restart low carbohydrate diet.   Dyspepsia -     esomeprazole (NEXIUM) 40 MG packet; Take 40 mg by mouth daily before breakfast.  History of peptic ulcer disease    . Reviewed expectations re: course of current medical issues. . Discussed self-management of symptoms. . Outlined signs and symptoms indicating need for more acute intervention. . Patient verbalized understanding and all questions were answered. Marland Kitchen Health Maintenance issues including appropriate healthy diet, exercise, and smoking avoidance were discussed with patient. . See orders for this visit as documented in the electronic medical record. . Patient received an After Visit Summary.  Briscoe Deutscher, DO [place], [place] 03/13/2018  Future Appointments  Date Time Provider [place]  04/29/2018  8:15 AM [facility]  06/10/2018  7:40 AM Briscoe Deutscher, DO [facility]   CMA served as scribe during this visit. History, Physical, and Plan performed by medical provider. The above documentation has been reviewed and is accurate and complete. Briscoe Deutscher, D.O.

## 2018-03-11 NOTE — Patient Instructions (Signed)
Ok to take the Synthroid 50mg / 50mg  / 75mg .

## 2018-03-14 LAB — CERVICOVAGINAL ANCILLARY ONLY
Bacterial vaginitis: NEGATIVE
Candida vaginitis: NEGATIVE
Trichomonas: NEGATIVE

## 2018-04-29 ENCOUNTER — Other Ambulatory Visit: Payer: Managed Care, Other (non HMO)

## 2018-06-03 ENCOUNTER — Other Ambulatory Visit (INDEPENDENT_AMBULATORY_CARE_PROVIDER_SITE_OTHER): Payer: Managed Care, Other (non HMO)

## 2018-06-03 ENCOUNTER — Other Ambulatory Visit: Payer: Managed Care, Other (non HMO)

## 2018-06-03 DIAGNOSIS — E039 Hypothyroidism, unspecified: Secondary | ICD-10-CM

## 2018-06-03 LAB — T4, FREE: Free T4: 1.07 ng/dL (ref 0.60–1.60)

## 2018-06-03 LAB — TSH: TSH: 1.44 u[IU]/mL (ref 0.35–4.50)

## 2018-06-09 NOTE — Progress Notes (Signed)
Caitlyn Page is a 52 y.o. female is here for follow up.  History of Present Illness:   Caitlyn Page, CMA acting as scribe for Dr. Briscoe Deutscher.   HPI: Patient in office for follow up. Shoulder pain has improved. She is taking the synthroid 75 daily now with no problems. She is working on eating well. Went to "ego seminar."   Review of Systems  Constitutional: Negative for chills, fever, malaise/fatigue and weight loss.  Respiratory: Negative for cough, shortness of breath and wheezing.   Cardiovascular: Negative for chest pain, palpitations and leg swelling.  Gastrointestinal: Negative for abdominal pain, constipation, diarrhea, nausea and vomiting.  Genitourinary: Negative for dysuria and urgency.  Musculoskeletal: Negative for joint pain and myalgias.  Skin: Negative for rash.  Neurological: Negative for dizziness and headaches.  Psychiatric/Behavioral: Negative for depression, substance abuse and suicidal ideas. The patient is not nervous/anxious.      There are no preventive care reminders to display for this patient.   Depression screen [facility] 2/9 06/10/2018 03/11/2018 12/10/2017  Decreased Interest 1 0 0  Down, Depressed, Hopeless 1 1 0  PHQ - 2 Score 2 1 0  Altered sleeping 1 0 0  Tired, decreased energy 1 0 0  Change in appetite 1 1 0  Feeling bad or failure about yourself  1 0 0  Trouble concentrating 1 1 0  Moving slowly or fidgety/restless 0 0 0  Suicidal thoughts 0 0 0  PHQ-9 Score 7 3 0  Difficult doing work/chores Not difficult at all - -   PMHx, SurgHx, SocialHx, FamHx, Medications, and Allergies were reviewed in the Visit Navigator and updated as appropriate.   Patient Active Problem List   Diagnosis Date Noted  . Insulin resistance 10/12/2017  . Anxiety 10/12/2017  . History of peptic ulcer disease 09/08/2017  . Hypothyroidism 08/16/2017   Social History   Tobacco Use  . Smoking status: Never Smoker  . Smokeless tobacco: Never Used  Substance Use  Topics  . Alcohol use: No  . Drug use: No   Current Medications and Allergies:   Current Outpatient Medications:  .  levothyroxine (SYNTHROID, LEVOTHROID) 75 MCG tablet, Take 1 tablet (75 mcg total) by mouth daily., Disp: 90 tablet, Rfl: 3  No Known Allergies Review of Systems   Pertinent items are noted in the HPI. Otherwise, ROS is negative.  Vitals:   Vitals:   06/10/18 0743  BP: 120/74  Pulse: 61  Temp: 98.6 F (37 C)  TempSrc: Oral  SpO2: 96%  Weight: 182 lb (82.6 kg)  Height: 5\' 4"  (1.626 m)     Body mass index is 31.24 kg/m.  Physical Exam:   Physical Exam  Constitutional: She appears well-nourished.  HENT:  Head: Normocephalic and atraumatic.  Eyes: Pupils are equal, round, and reactive to light. EOM are normal.  Neck: Normal range of motion. Neck supple.  Cardiovascular: Normal rate, regular rhythm, normal heart sounds and intact distal pulses.  Pulmonary/Chest: Effort normal.  Abdominal: Soft.  Skin: Skin is warm.  Psychiatric: She has a normal mood and affect. Her behavior is normal.  Nursing note and vitals reviewed.  Results for orders placed or performed in visit on 06/10/18  POCT glycosylated hemoglobin (Hb A1C)  Result Value Ref Range   Hemoglobin A1C 5.5 4.0 - 5.6 %   HbA1c POC (<> result, manual entry)  4.0 - 5.6 %   HbA1c, POC (prediabetic range)  5.7 - 6.4 %   HbA1c, POC (controlled diabetic  range)  0.0 - 7.0 %    Assessment and Plan:   Caitlyn Page was seen today for follow-up.  Diagnoses and all orders for this visit:  Insulin resistance Comments: Doing well. A1c stable. Weight stable.  Orders: -     POCT glycosylated hemoglobin (Hb A1C)  Acquired hypothyroidism Comments: Doing well. Continue current treatment.  Orders: -     TSH; Future -     T4, free; Future -     levothyroxine (SYNTHROID, LEVOTHROID) 75 MCG tablet; Take 1 tablet (75 mcg total) by mouth daily.  Anxiety Comments: Stable. Continue current treatment.   History  of peptic ulcer disease Comments: Resolved.     . Reviewed expectations re: course of current medical issues. . Discussed self-management of symptoms. . Outlined signs and symptoms indicating need for more acute intervention. . Patient verbalized understanding and all questions were answered. Marland Kitchen Health Maintenance issues including appropriate healthy diet, exercise, and smoking avoidance were discussed with patient. . See orders for this visit as documented in the electronic medical record. . Patient received an After Visit Summary.  Briscoe Deutscher, DO [place], [place] 06/11/2018  Future Appointments  Date Time Provider [place]  08/19/2018  8:30 AM [facility]  12/16/2018  8:00 AM Briscoe Deutscher, DO [facility]    CMA served as scribe during this visit. History, Physical, and Plan performed by medical provider. The above documentation has been reviewed and is accurate and complete. Briscoe Deutscher, D.O.

## 2018-06-10 ENCOUNTER — Encounter: Payer: Self-pay | Admitting: Family Medicine

## 2018-06-10 ENCOUNTER — Ambulatory Visit: Payer: Managed Care, Other (non HMO) | Admitting: Family Medicine

## 2018-06-10 VITALS — BP 120/74 | HR 61 | Temp 98.6°F | Ht 64.0 in | Wt 182.0 lb

## 2018-06-10 DIAGNOSIS — F419 Anxiety disorder, unspecified: Secondary | ICD-10-CM

## 2018-06-10 DIAGNOSIS — E8881 Metabolic syndrome: Secondary | ICD-10-CM | POA: Diagnosis not present

## 2018-06-10 DIAGNOSIS — E039 Hypothyroidism, unspecified: Secondary | ICD-10-CM

## 2018-06-10 DIAGNOSIS — Z8711 Personal history of peptic ulcer disease: Secondary | ICD-10-CM

## 2018-06-10 LAB — POCT GLYCOSYLATED HEMOGLOBIN (HGB A1C): Hemoglobin A1C: 5.5 % (ref 4.0–5.6)

## 2018-06-10 MED ORDER — LEVOTHYROXINE SODIUM 75 MCG PO TABS: 75.0000 ug | ORAL_TABLET | Freq: Every day | ORAL | 3 refills | Status: DC

## 2018-07-01 ENCOUNTER — Telehealth: Payer: Self-pay | Admitting: Family Medicine

## 2018-07-01 NOTE — Telephone Encounter (Signed)
Okay. Recheck lab in 6 weeks.

## 2018-07-01 NOTE — Telephone Encounter (Signed)
See note

## 2018-07-01 NOTE — Telephone Encounter (Signed)
Copied from [place] [id number]. Topic: General - Other >> Jul 01, 2018  2:19 PM Lennox Solders wrote: Reason for CRM: pt is having hair loss and would like to know if she can take 50 mcg of synthroid med instead of 75 mcg. Pt had refills on 50 mcg. Pt will make ov to discuss if needed. Pt would like a callback

## 2018-07-01 NOTE — Telephone Encounter (Signed)
Please advise 

## 2018-07-01 NOTE — Telephone Encounter (Signed)
Spoke with patient and she is going to do the 50 mcg. She has lost hand fulls of hair. She has lab appointment already scheduled.

## 2018-08-19 ENCOUNTER — Other Ambulatory Visit (INDEPENDENT_AMBULATORY_CARE_PROVIDER_SITE_OTHER): Payer: Managed Care, Other (non HMO)

## 2018-08-19 DIAGNOSIS — E039 Hypothyroidism, unspecified: Secondary | ICD-10-CM | POA: Diagnosis not present

## 2018-08-19 LAB — T4, FREE: Free T4: 1.13 ng/dL (ref 0.60–1.60)

## 2018-08-19 LAB — TSH: TSH: 1.89 u[IU]/mL (ref 0.35–4.50)

## 2018-08-29 ENCOUNTER — Other Ambulatory Visit: Payer: Self-pay | Admitting: Family Medicine

## 2018-08-29 DIAGNOSIS — Z1231 Encounter for screening mammogram for malignant neoplasm of breast: Secondary | ICD-10-CM

## 2018-09-02 ENCOUNTER — Ambulatory Visit: Payer: Managed Care, Other (non HMO) | Admitting: Family Medicine

## 2018-09-26 ENCOUNTER — Other Ambulatory Visit: Payer: Self-pay | Admitting: Family Medicine

## 2018-09-29 ENCOUNTER — Ambulatory Visit
Admission: RE | Admit: 2018-09-29 | Discharge: 2018-09-29 | Disposition: A | Discharge status: Home / Self Care | Payer: Managed Care, Other (non HMO) | Source: Ambulatory Visit | Attending: Family Medicine | Admitting: Family Medicine

## 2018-09-29 DIAGNOSIS — Z1231 Encounter for screening mammogram for malignant neoplasm of breast: Secondary | ICD-10-CM

## 2018-12-15 NOTE — Progress Notes (Signed)
Caitlyn Page is a 53 y.o. female is here for follow up.  History of Present Illness:   Caitlyn Page, CMA acting as scribe for Dr. Briscoe Page.   HPI: Patient is getting adjusted more in town. She has gained around 11lbs from last visit. She would like to talk about options that she has. She does not have any interest in taking metformin. She will be going to another wellness site soon to learn better lifestyle patterns.   Hypothyroidism Current symptoms: none. Patient denies change in energy level. Symptoms have stabilized. Lab Results  Component Value Date   TSH 2.13 12/16/2018   TSH 1.89 08/19/2018   TSH 1.44 06/03/2018   Lab Results  Component Value Date   FREET4 1.06 12/16/2018   FREET4 1.13 08/19/2018   FREET4 1.07 06/03/2018     Insulin Resistance Last A1C was done 06/10/18 and was 5.5.   Anxiety  Current symptoms: none. No current suicidal and homicidal ideation. Side effects from treatment: none. She feels like their has been a lot of improvement in life style that has helped with this.    There are no preventive care reminders to display for this patient.   Depression screen [facility] 2/9 12/16/2018 06/10/2018 03/11/2018  Decreased Interest 0 1 0  Down, Depressed, Hopeless 0 1 1  PHQ - 2 Score 0 2 1  Altered sleeping 1 1 0  Tired, decreased energy 1 1 0  Change in appetite 2 1 1   Feeling bad or failure about yourself  0 1 0  Trouble concentrating 0 1 1  Moving slowly or fidgety/restless 0 0 0  Suicidal thoughts 0 0 0  PHQ-9 Score 4 7 3   Difficult doing work/chores Not difficult at all Not difficult at all -   PMHx, SurgHx, SocialHx, FamHx, Medications, and Allergies were reviewed in the Visit Navigator and updated as appropriate.   Patient Active Problem List   Diagnosis Date Noted  . Insulin resistance 10/12/2017  . Anxiety 10/12/2017  . History of peptic ulcer disease 09/08/2017  . Hypothyroidism 08/16/2017   Social History   Tobacco Use  . Smoking  status: Never Smoker  . Smokeless tobacco: Never Used  Substance Use Topics  . Alcohol use: No  . Drug use: No   Current Medications and Allergies   .  SYNTHROID 50 MCG tablet, Take 1 tablet (50 mcg total) by mouth daily., Disp: 90 tablet, Rfl: 1  No Known Allergies   Review of Systems   Pertinent items are noted in the HPI. Otherwise, a complete ROS is negative.  Vitals   Vitals:   12/16/18 0812  BP: 122/68  Pulse: 74  Temp: 98.2 F (36.8 C)  TempSrc: Oral  SpO2: 98%  Weight: 193 lb 3.2 oz (87.6 kg)  Height: 5\' 4"  (1.626 m)     Body mass index is 33.16 kg/m.  Physical Exam   Physical Exam Vitals signs and nursing note reviewed.  HENT:     Head: Normocephalic and atraumatic.  Eyes:     Pupils: Pupils are equal, round, and reactive to light.  Neck:     Musculoskeletal: Normal range of motion and neck supple.  Cardiovascular:     Rate and Rhythm: Normal rate and regular rhythm.     Heart sounds: Normal heart sounds.  Pulmonary:     Effort: Pulmonary effort is normal.  Abdominal:     Palpations: Abdomen is soft.  Skin:    General: Skin is warm.  Psychiatric:  Behavior: Behavior normal.    Assessment and Plan   Caitlyn Page was seen today for follow-up.  Diagnoses and all orders for this visit:  Acquired hypothyroidism -     SYNTHROID 50 MCG tablet; Take 1 tablet (50 mcg total) by mouth daily. -     T4, free -     TSH  Insulin resistance -     Comprehensive metabolic panel -     Hemoglobin A1c  History of peptic ulcer disease  Anxiety Comments: Worsened since our last visit, but due to work. She is feeling better now, focusing on self-care.   Vertigo Comments: Chronic, intermittent. Has appointment with ENT today.  Orders: -     Ambulatory referral to ENT -     CBC with Differential/Platelet  Screening for malignant neoplasm of colon -     Ambulatory referral to Gastroenterology  Screening for lipid disorders -     Lipid  panel  Vitamin D deficiency -     VITAMIN D 25 Hydroxy (Vit-D Deficiency, Fractures)    . Orders and follow up as documented in [place], reviewed diet, exercise and weight control, cardiovascular risk and specific lipid/LDL goals reviewed, reviewed medications and side effects in detail.  . Reviewed expectations re: course of current medical issues. . Outlined signs and symptoms indicating need for more acute intervention. . Patient verbalized understanding and all questions were answered. . Patient received an After Visit Summary.  CMA served as Education administrator during this visit. History, Physical, and Plan performed by medical provider. The above documentation has been reviewed and is accurate and complete. Caitlyn Page, D.O.  Caitlyn Deutscher, DO [place], [place] 12/17/2018

## 2018-12-16 ENCOUNTER — Encounter: Payer: Self-pay | Admitting: Family Medicine

## 2018-12-16 ENCOUNTER — Ambulatory Visit: Payer: Managed Care, Other (non HMO) | Admitting: Family Medicine

## 2018-12-16 VITALS — BP 122/68 | HR 74 | Temp 98.2°F | Ht 64.0 in | Wt 193.2 lb

## 2018-12-16 DIAGNOSIS — F419 Anxiety disorder, unspecified: Secondary | ICD-10-CM

## 2018-12-16 DIAGNOSIS — Z8711 Personal history of peptic ulcer disease: Secondary | ICD-10-CM

## 2018-12-16 DIAGNOSIS — E8881 Metabolic syndrome: Secondary | ICD-10-CM | POA: Diagnosis not present

## 2018-12-16 DIAGNOSIS — Z13220 Encounter for screening for lipoid disorders: Secondary | ICD-10-CM | POA: Diagnosis not present

## 2018-12-16 DIAGNOSIS — Z1211 Encounter for screening for malignant neoplasm of colon: Secondary | ICD-10-CM

## 2018-12-16 DIAGNOSIS — E559 Vitamin D deficiency, unspecified: Secondary | ICD-10-CM

## 2018-12-16 DIAGNOSIS — E039 Hypothyroidism, unspecified: Secondary | ICD-10-CM | POA: Diagnosis not present

## 2018-12-16 DIAGNOSIS — R42 Dizziness and giddiness: Secondary | ICD-10-CM

## 2018-12-16 LAB — COMPREHENSIVE METABOLIC PANEL
ALT: 15 U/L (ref 0–35)
AST: 19 U/L (ref 0–37)
Albumin: 4.1 g/dL (ref 3.5–5.2)
Alkaline Phosphatase: 74 U/L (ref 39–117)
BUN: 11 mg/dL (ref 6–23)
CO2: 30 mEq/L (ref 19–32)
Calcium: 9.7 mg/dL (ref 8.4–10.5)
Chloride: 105 mEq/L (ref 96–112)
Creatinine, Ser: 1.04 mg/dL (ref 0.40–1.20)
GFR: 55.66 mL/min — ABNORMAL LOW (ref >60.00)
Glucose, Bld: 95 mg/dL (ref 70–99)
Potassium: 5.3 mEq/L — ABNORMAL HIGH (ref 3.5–5.1)
Sodium: 142 mEq/L (ref 135–145)
Total Bilirubin: 0.4 mg/dL (ref 0.2–1.2)
Total Protein: 6.7 g/dL (ref 6.0–8.3)

## 2018-12-16 LAB — CBC WITH DIFFERENTIAL/PLATELET
Basophils Absolute: 0 10*3/uL (ref 0.0–0.1)
Basophils Relative: 1 % (ref 0.0–3.0)
Eosinophils Absolute: 0.2 10*3/uL (ref 0.0–0.7)
Eosinophils Relative: 3.7 % (ref 0.0–5.0)
HCT: 38.9 % (ref 36.0–46.0)
Hemoglobin: 12.6 g/dL (ref 12.0–15.0)
Lymphocytes Relative: 40.4 % (ref 12.0–46.0)
Lymphs Abs: 2 10*3/uL (ref 0.7–4.0)
MCHC: 32.5 g/dL (ref 30.0–36.0)
MCV: 92.1 fl (ref 78.0–100.0)
Monocytes Absolute: 0.4 10*3/uL (ref 0.1–1.0)
Monocytes Relative: 7.7 % (ref 3.0–12.0)
Neutro Abs: 2.3 10*3/uL (ref 1.4–7.7)
Neutrophils Relative %: 47.2 % (ref 43.0–77.0)
Platelets: 334 10*3/uL (ref 150.0–400.0)
RBC: 4.22 Mil/uL (ref 3.87–5.11)
RDW: 14.2 % (ref 11.5–15.5)
WBC: 4.8 10*3/uL (ref 4.0–10.5)

## 2018-12-16 LAB — HEMOGLOBIN A1C: Hgb A1c MFr Bld: 5.9 % (ref 4.6–6.5)

## 2018-12-16 LAB — LIPID PANEL
Cholesterol: 225 mg/dL — ABNORMAL HIGH (ref 0–200)
HDL: 78.3 mg/dL (ref >39.00)
LDL Cholesterol: 131 mg/dL — ABNORMAL HIGH (ref 0–99)
NonHDL: 146.92
Total CHOL/HDL Ratio: 3
Triglycerides: 79 mg/dL (ref 0.0–149.0)
VLDL: 15.8 mg/dL (ref 0.0–40.0)

## 2018-12-16 LAB — TSH: TSH: 2.13 u[IU]/mL (ref 0.35–4.50)

## 2018-12-16 LAB — T4, FREE: Free T4: 1.06 ng/dL (ref 0.60–1.60)

## 2018-12-16 LAB — VITAMIN D 25 HYDROXY (VIT D DEFICIENCY, FRACTURES): VITD: 39.88 ng/mL (ref 30.00–100.00)

## 2018-12-16 MED ORDER — SYNTHROID 50 MCG PO TABS: 50.0000 ug | ORAL_TABLET | Freq: Every day | ORAL | 1 refills | Status: DC

## 2018-12-16 NOTE — Patient Instructions (Addendum)
BONE UP (brand) seems to be the best that I have found for bone health and can be ordered from [employer].  Follow-Up: At [facility], you and your health needs are our priority.  As part of our continuing mission to provide you with exceptional primary care, we have created designated Provider Care Teams.  These Care Teams include your Primary Care Provider (Dr. Briscoe Deutscher) and an Advanced Practice Provider Inda Coke, Physician Assistant) who work together to provide you with the care you need, when you need it.   Please schedule your follow-up appointment from today with Inda Coke, PA-C.

## 2018-12-19 ENCOUNTER — Other Ambulatory Visit: Payer: Self-pay | Admitting: *Deleted

## 2018-12-19 DIAGNOSIS — E875 Hyperkalemia: Secondary | ICD-10-CM

## 2018-12-21 ENCOUNTER — Ambulatory Visit: Payer: Managed Care, Other (non HMO) | Admitting: Neurology

## 2018-12-21 ENCOUNTER — Encounter: Payer: Self-pay | Admitting: Neurology

## 2018-12-21 VITALS — BP 121/90 | HR 78 | Ht 64.0 in | Wt 191.0 lb

## 2018-12-21 DIAGNOSIS — R51 Headache with orthostatic component, not elsewhere classified: Secondary | ICD-10-CM

## 2018-12-21 DIAGNOSIS — H81399 Other peripheral vertigo, unspecified ear: Secondary | ICD-10-CM | POA: Diagnosis not present

## 2018-12-21 DIAGNOSIS — R42 Dizziness and giddiness: Secondary | ICD-10-CM | POA: Diagnosis not present

## 2018-12-21 DIAGNOSIS — G44229 Chronic tension-type headache, not intractable: Secondary | ICD-10-CM

## 2018-12-21 DIAGNOSIS — G43109 Migraine with aura, not intractable, without status migrainosus: Secondary | ICD-10-CM

## 2018-12-21 DIAGNOSIS — H547 Unspecified visual loss: Secondary | ICD-10-CM

## 2018-12-21 DIAGNOSIS — G43809 Other migraine, not intractable, without status migrainosus: Secondary | ICD-10-CM

## 2018-12-21 DIAGNOSIS — H93A9 Pulsatile tinnitus, unspecified ear: Secondary | ICD-10-CM

## 2018-12-21 NOTE — Progress Notes (Signed)
[facility]    Provider:  Dr Jaynee Eagles Referring Provider: Minna Merritts, MD Primary Care Provider:  Briscoe Deutscher, DO  CC:  vertigo  HPI:  Caitlyn Page is a 53 y.o. female here as requested by Minna Merritts, MD  for vertigo. PMHx hypothyroidism, CKD, depression, headaches, obesity, fatigue. She has a long standing history of intermittent dizziness (especially when looking down) for about 6 years. She has had intense episodes of vertigo that started about 4 months ago. She reports that the room is spinning before she opens her eyes in the mornings. Episodes were happening most days at that time. She first sought medical treatment last week. She was told that she had BPPV. She performed Epley at home that helps. She thought that vertigo had resolved until today. This morning she woke with an intense spinning sensation. She also noticed that she had a headache. She has a history of migraines but had not correlated these with dizziness. She has about 4-5 headache days a month. 2-3 are migrainous. Migraines are described as a throbbing sensation. Usually unilateral. Typically last hours to days. She is sensitive to light. She has nausea and double vision at times. She has had several headaches where she sees lights and has lost peripheral vision. Also pulsating in the ear with ringing.   She takes Excedrin Migraine that helps. She usually needs to take medication 2 times a month for headaches. She is on a computer or cell phone at least 12 hours a day. She has tried massages that helps. Does often feel that her right ear is congested. She does have neck pain.   She was seen by her opthalmologist 2 weeks ago and did get new prescription lenses. She has used these for 2 days. She did get blue filter in lenses. She continues to feel sensitive to light with filter.   She reports that her sister has the exact same symptoms with migraines and vertigo.   She has not had any imaging of her  brain recently.   She was diagnosed with sleep apnea about 6 years ago and advised to use CPAP. She does not believe in sleep apnea and does not wish to treat this at this time. She knows that she snores. She denies frequent morning headaches. She has gained 20 pounds in the past year. She admits that she has a high stress job. She has never been treated for anxiety or depression.    Reviewed notes, labs and imaging from outside physicians, which showed:  Personally spoke to Dr. Ernesto Rutherford today, ENT who referred patient. She has vertigo but negative nystagmus testing, normal hearing, she has migraines and he would like her evaluated for vestibular symptoms due to migraines.   Do not have Dr. Berle Mull notes, requested. He is sending them over to [place] today.   Cbc normal. CMP showed elevated K+ but nml BUN and Creatinine  Review of Systems: Patient complains of symptoms per HPI as well as the following symptoms weight gain, blurred vision, double vision, eye pain, feeling cold, spinning sensation, constipation, feeling cold, increased thirst, joint pain, achy muscles, headaches, dizziness, change in appetite, racing thoughts, insomnia, sleepiness, snoring. Pertinent negatives and positives per HPI. All others negative.   Social History   Socioeconomic History  . Marital status: Single    Spouse name: Not on file  . Number of children: 0  . Years of education: Not on file  . Highest education level: Not on file  Occupational History  .  Not on file  Social Needs  . Financial resource strain: Not on file  . Food insecurity:    Worry: Not on file    Inability: Not on file  . Transportation needs:    Medical: Not on file    Non-medical: Not on file  Tobacco Use  . Smoking status: Never Smoker  . Smokeless tobacco: Never Used  Substance and Sexual Activity  . Alcohol use: No  . Drug use: No  . Sexual activity: Not on file  Lifestyle  . Physical activity:    Days per week: Not on  file    Minutes per session: Not on file  . Stress: Not on file  Relationships  . Social connections:    Talks on phone: Not on file    Gets together: Not on file    Attends religious service: Not on file    Active member of club or organization: Not on file    Attends meetings of clubs or organizations: Not on file    Relationship status: Not on file  . Intimate partner violence:    Fear of current or ex partner: Not on file    Emotionally abused: Not on file    Physically abused: Not on file    Forced sexual activity: Not on file  Other Topics Concern  . Not on file  Social History Narrative   Lives at home alone    Right handed   Caffeine: a few times per week    Family History  Problem Relation Age of Onset  . Breast cancer Mother   . Aneurysm Mother   . Heart Problems Father   . Headache Sister   . Other Sister        vertigo    Past Medical History:  Diagnosis Date  . Chronic kidney disease   . Depression   . Frequent headaches   . Prediabetes   . Right foot pain   . Sinus problem   . Thyroid disease   . Vision problems     Patient Active Problem List   Diagnosis Date Noted  . Vertigo 12/23/2018  . Vestibular migraine 12/23/2018  . Insulin resistance 10/12/2017  . Anxiety 10/12/2017  . History of peptic ulcer disease 09/08/2017  . Hypothyroidism 08/16/2017    Past Surgical History:  Procedure Laterality Date  . sinus surgery   2000  . TONSILLECTOMY AND ADENOIDECTOMY     Sometime in the 1980's    Current Outpatient Medications  Medication Sig Dispense Refill  . Ascorbic Acid (VITAMIN C PO) Take by mouth.    . Calcium-Magnesium-Zinc (CAL-MAG-ZINC PO) Take by mouth.    . Cholecalciferol (VITAMIN D3 PO) Take by mouth.    . Coenzyme Q10 (CO Q 10 PO) Take by mouth.    . Omega-3 Fatty Acids (FISH OIL PO) Take by mouth.    . Saw Palmetto, Serenoa repens, (SAW PALMETTO PO) Take by mouth.    . SYNTHROID 50 MCG tablet Take 1 tablet (50 mcg total) by  mouth daily. 90 tablet 1  . TURMERIC PO Take by mouth.     No current facility-administered medications for this visit.     Allergies as of 12/21/2018 - Review Complete 12/21/2018  Allergen Reaction Noted  . Fructose  12/21/2018  . Gluten meal  12/21/2018    Vitals: BP 121/90 (BP Location: Right Arm, Patient Position: Sitting)   Pulse 78   Ht 5\' 4"  (1.626 m)   Wt 191 lb (86.6  kg)   BMI 32.79 kg/m  Last Weight:  Wt Readings from Last 1 Encounters:  12/21/18 191 lb (86.6 kg)   Last Height:   Ht Readings from Last 1 Encounters:  12/21/18 5\' 4"  (1.626 m)     Physical exam: Exam: Gen: NAD, conversant, well nourised, obese, well groomed                     CV: RRR, no MRG. No Carotid Bruits. No peripheral edema, warm, nontender Eyes: Conjunctivae clear without exudates or hemorrhage  Neuro: Detailed Neurologic Exam  Speech:    Speech is normal; fluent and spontaneous with normal comprehension.  Cognition:    The patient is oriented to person, place, and time;     recent and remote memory intact;     language fluent;     normal attention, concentration,     fund of knowledge Cranial Nerves:    The pupils are equal, round, and reactive to light. The fundi are normal and spontaneous venous pulsations are present. Visual fields are full to finger confrontation. Extraocular movements are intact. Trigeminal sensation is intact and the muscles of mastication are normal. The face is symmetric. The palate elevates in the midline. Hearing intact. Voice is normal. Shoulder shrug is normal. The tongue has normal motion without fasciculations.   Coordination:    Normal finger to nose and heel to shin. Normal rapid alternating movements.   Gait:    Heel-toe and tandem gait are normal.   Motor Observation:    No asymmetry, no atrophy, and no involuntary movements noted. Tone:    Normal muscle tone.    Posture:    Posture is normal. normal erect    Strength:    Strength is  V/V in the upper and lower limbs.      Sensation: intact to LT     Reflex Exam:  DTR's:    Deep tendon reflexes in the upper and lower extremities are normal bilaterally.   Toes:    The toes are downgoing bilaterally.   Clonus:    Clonus is absent.    Assessment/Plan:  53 year old with chronic intractable vertigo. ENT evaluation negative. May be migrainous. But given other concerning symptoms she needs thorough eval with MRI/MRA.  MRI brain due to concerning symptoms of morning headaches, positional headaches,vision changes, vertigo  to look for space occupying mass, stroke, schwannoma, chiari or intracranial hypertension (pseudotumor) or other intracranial etiologies.  MRA of the head due to pulsatile tinnitus and FHx of aneurysms.  Vestibular therapy for central vertigo(migrainous).   ALSO Dry Needling for cervical myofascial pain syndrome. Send to [address]  Discussed treatment of migraines, she can follow up after workup to discuss if she chooses to try and treat.  Orders Placed This Encounter  Procedures  . MR BRAIN W WO CONTRAST  . MR MRA HEAD WO CONTRAST  . Ambulatory referral to Physical Therapy    Cc: Briscoe Deutscher, DO,  Minna Merritts, MD  A total of 80 minutes was spent face-to-face with this patient. Over half this time was spent on counseling patient on the  1. Vertigo   2. Chronic tension-type headache, not intractable   3. Dizziness   4. Vestibular migraine   5. Other peripheral vertigo, unspecified ear   6. Positional headache   7. Pulsatile tinnitus   8. Vision loss     diagnosis and different diagnostic and therapeutic options, counseling and coordination of care, risks ans benefits  of management, compliance, or risk factor reduction and education.     Sarina Ill, MD  [facility] [address] [place], [place] [address]  Phone [phone] Fax [phone]

## 2018-12-21 NOTE — Patient Instructions (Signed)
We will order MRI of brain and vessels Continue to monitor symptoms  Migraine Headache A migraine headache is an intense, throbbing pain on one side or both sides of the head. Migraines may also cause other symptoms, such as nausea, vomiting, and sensitivity to light and noise. What are the causes? Doing or taking certain things may also trigger migraines, such as:  Alcohol.  Smoking.  Medicines, such as: ? Medicine used to treat chest pain (nitroglycerine). ? Birth control pills. ? Estrogen pills. ? Certain blood pressure medicines.  Aged cheeses, chocolate, or caffeine.  Foods or drinks that contain nitrates, glutamate, aspartame, or tyramine.  Physical activity. Other things that may trigger a migraine include:  Menstruation.  Pregnancy.  Hunger.  Stress, lack of sleep, too much sleep, or fatigue.  Weather changes. What increases the risk? The following factors may make you more likely to experience migraine headaches:  Age. Risk increases with age.  Family history of migraine headaches.  Being Caucasian.  Depression and anxiety.  Obesity.  Being a woman.  Having a hole in the heart (patent foramen ovale) or other heart problems. What are the signs or symptoms? The main symptom of this condition is pulsating or throbbing pain. Pain may:  Happen in any area of the head, such as on one side or both sides.  Interfere with daily activities.  Get worse with physical activity.  Get worse with exposure to bright lights or loud noises. Other symptoms may include:  Nausea.  Vomiting.  Dizziness.  General sensitivity to bright lights, loud noises, or smells. Before you get a migraine, you may get warning signs that a migraine is developing (aura). An aura may include:  Seeing flashing lights or having blind spots.  Seeing bright spots, halos, or zigzag lines.  Having tunnel vision or blurred vision.  Having numbness or a tingling  feeling.  Having trouble talking.  Having muscle weakness. How is this diagnosed? A migraine headache can be diagnosed based on:  Your symptoms.  A physical exam.  Tests, such as CT scan or MRI of the head. These imaging tests can help rule out other causes of headaches.  Taking fluid from the spine (lumbar puncture) and analyzing it (cerebrospinal fluid analysis, or CSF analysis). How is this treated? A migraine headache is usually treated with medicines that:  Relieve pain.  Relieve nausea.  Prevent migraines from coming back. Treatment may also include:  Acupuncture.  Lifestyle changes like avoiding foods that trigger migraines. Follow these instructions at home: Medicines  Take over-the-counter and prescription medicines only as told by your health care provider.  Do not drive or use heavy machinery while taking prescription pain medicine.  To prevent or treat constipation while you are taking prescription pain medicine, your health care provider may recommend that you: ? Drink enough fluid to keep your urine clear or pale yellow. ? Take over-the-counter or prescription medicines. ? Eat foods that are high in fiber, such as fresh fruits and vegetables, whole grains, and beans. ? Limit foods that are high in fat and processed sugars, such as fried and sweet foods. Lifestyle  Avoid alcohol use.  Do not use any products that contain nicotine or tobacco, such as cigarettes and e-cigarettes. If you need help quitting, ask your health care provider.  Get at least 8 hours of sleep every night.  Limit your stress. General instructions      Keep a journal to find out what may trigger your migraine headaches. For example,  write down: ? What you eat and drink. ? How much sleep you get. ? Any change to your diet or medicines.  If you have a migraine: ? Avoid things that make your symptoms worse, such as bright lights. ? It may help to lie down in a dark, quiet  room. ? Do not drive or use heavy machinery. ? Ask your health care provider what activities are safe for you while you are experiencing symptoms.  Keep all follow-up visits as told by your health care provider. This is important. Contact a health care provider if:  You develop symptoms that are different or more severe than your usual migraine symptoms. Get help right away if:  Your migraine becomes severe.  You have a fever.  You have a stiff neck.  You have vision loss.  Your muscles feel weak or like you cannot control them.  You start to lose your balance often.  You develop trouble walking.  You faint. This information is not intended to replace advice given to you by your health care provider. Make sure you discuss any questions you have with your health care provider. Document Released: 11/02/2005 Document Revised: 05/22/2016 Document Reviewed: 04/20/2016 Elsevier Interactive Patient Education  2019 [employer]. Vertigo Vertigo is the feeling that you or your surroundings are moving when they are not. Vertigo can be dangerous if it occurs while you are doing something that could endanger you or others, such as driving. What are the causes? This condition is caused by a disturbance in the signals that are sent by your body's sensory systems to your brain. Different causes of a disturbance can lead to vertigo, including:  Infections, especially in the inner ear.  A bad reaction to a drug, or misuse of alcohol and medicines.  Withdrawal from drugs or alcohol.  Quickly changing positions, as when lying down or rolling over in bed.  Migraine headaches.  Decreased blood flow to the brain.  Decreased blood pressure.  Increased pressure in the brain from a head or neck injury, stroke, infection, tumor, or bleeding.  Central nervous system disorders. What are the signs or symptoms? Symptoms of this condition usually occur when you move your head or your eyes in  different directions. Symptoms may start suddenly, and they usually last for less than a minute. Symptoms may include:  Loss of balance and falling.  Feeling like you are spinning or moving.  Feeling like your surroundings are spinning or moving.  Nausea and vomiting.  Blurred vision or double vision.  Difficulty hearing.  Slurred speech.  Dizziness.  Involuntary eye movement (nystagmus). Symptoms can be mild and cause only slight annoyance, or they can be severe and interfere with daily life. Episodes of vertigo may return (recur) over time, and they are often triggered by certain movements. Symptoms may improve over time. How is this diagnosed? This condition may be diagnosed based on medical history and the quality of your nystagmus. Your health care provider may test your eye movements by asking you to quickly change positions to trigger the nystagmus. This may be called the Dix-Hallpike test, head thrust test, or roll test. You may be referred to a health care provider who specializes in ear, nose, and throat (ENT) problems (otolaryngologist) or a provider who specializes in disorders of the central nervous system (neurologist). You may have additional testing, including:  A physical exam.  Blood tests.  MRI.  A CT scan.  An electrocardiogram (ECG). This records electrical activity in your heart.  An electroencephalogram (EEG). This records electrical activity in your brain.  Hearing tests. How is this treated? Treatment for this condition depends on the cause and the severity of the symptoms. Treatment options include:  Medicines to treat nausea or vertigo. These are usually used for severe cases. Some medicines that are used to treat other conditions may also reduce or eliminate vertigo symptoms. These include: ? Medicines that control allergies (antihistamines). ? Medicines that control seizures (anticonvulsants). ? Medicines that relieve depression  (antidepressants). ? Medicines that relieve anxiety (sedatives).  Head movements to adjust your inner ear back to normal. If your vertigo is caused by an ear problem, your health care provider may recommend certain movements to correct the problem.  Surgery. This is rare. Follow these instructions at home: Safety  Move slowly.Avoid sudden body or head movements.  Avoid driving.  Avoid operating heavy machinery.  Avoid doing any tasks that would cause danger to you or others if you would have a vertigo episode during the task.  If you have trouble walking or keeping your balance, try using a cane for stability. If you feel dizzy or unstable, sit down right away.  Return to your normal activities as told by your health care provider. Ask your health care provider what activities are safe for you. General instructions  Take over-the-counter and prescription medicines only as told by your health care provider.  Avoid certain positions or movements as told by your health care provider.  Drink enough fluid to keep your urine clear or pale yellow.  Keep all follow-up visits as told by your health care provider. This is important. Contact a health care provider if:  Your medicines do not relieve your vertigo or they make it worse.  You have a fever.  Your condition gets worse or you develop new symptoms.  Your family or friends notice any behavioral changes.  Your nausea or vomiting gets worse.  You have numbness or a "pins and needles" sensation in part of your body. Get help right away if:  You have difficulty moving or speaking.  You are always dizzy.  You faint.  You develop severe headaches.  You have weakness in your hands, arms, or legs.  You have changes in your hearing or vision.  You develop a stiff neck.  You develop sensitivity to light. This information is not intended to replace advice given to you by your health care provider. Make sure you discuss  any questions you have with your health care provider. Document Released: 08/12/2005 Document Revised: 04/15/2016 Document Reviewed: 02/25/2015 Elsevier Interactive Patient Education  2019 [employer].

## 2018-12-23 ENCOUNTER — Other Ambulatory Visit (INDEPENDENT_AMBULATORY_CARE_PROVIDER_SITE_OTHER): Payer: Managed Care, Other (non HMO)

## 2018-12-23 ENCOUNTER — Encounter: Payer: Self-pay | Admitting: Neurology

## 2018-12-23 ENCOUNTER — Telehealth: Payer: Self-pay | Admitting: Neurology

## 2018-12-23 DIAGNOSIS — R42 Dizziness and giddiness: Secondary | ICD-10-CM | POA: Insufficient documentation

## 2018-12-23 DIAGNOSIS — E875 Hyperkalemia: Secondary | ICD-10-CM | POA: Diagnosis not present

## 2018-12-23 DIAGNOSIS — G43109 Migraine with aura, not intractable, without status migrainosus: Secondary | ICD-10-CM

## 2018-12-23 DIAGNOSIS — G43809 Other migraine, not intractable, without status migrainosus: Secondary | ICD-10-CM | POA: Insufficient documentation

## 2018-12-23 LAB — BASIC METABOLIC PANEL
BUN: 14 mg/dL (ref 6–23)
CO2: 27 mEq/L (ref 19–32)
Calcium: 9.4 mg/dL (ref 8.4–10.5)
Chloride: 104 mEq/L (ref 96–112)
Creatinine, Ser: 1.04 mg/dL (ref 0.40–1.20)
GFR: 55.66 mL/min — ABNORMAL LOW (ref >60.00)
Glucose, Bld: 76 mg/dL (ref 70–99)
Potassium: 4.5 mEq/L (ref 3.5–5.1)
Sodium: 140 mEq/L (ref 135–145)

## 2018-12-23 NOTE — Telephone Encounter (Signed)
[employer] order sent to GI. They obtain the auth and will reach out to the pt to schedule.  °

## 2019-01-14 ENCOUNTER — Encounter (HOSPITAL_COMMUNITY): Payer: Self-pay

## 2019-01-14 ENCOUNTER — Ambulatory Visit (INDEPENDENT_AMBULATORY_CARE_PROVIDER_SITE_OTHER): Payer: Managed Care, Other (non HMO)

## 2019-01-14 ENCOUNTER — Ambulatory Visit (HOSPITAL_COMMUNITY)
Admission: EM | Admit: 2019-01-14 | Discharge: 2019-01-14 | Disposition: A | Discharge status: Home / Self Care | Payer: Managed Care, Other (non HMO) | Attending: Family Medicine | Admitting: Family Medicine

## 2019-01-14 DIAGNOSIS — S2241XA Multiple fractures of ribs, right side, initial encounter for closed fracture: Secondary | ICD-10-CM | POA: Diagnosis not present

## 2019-01-14 DIAGNOSIS — R0781 Pleurodynia: Secondary | ICD-10-CM

## 2019-01-14 DIAGNOSIS — W19XXXA Unspecified fall, initial encounter: Secondary | ICD-10-CM

## 2019-01-14 DIAGNOSIS — R079 Chest pain, unspecified: Secondary | ICD-10-CM

## 2019-01-14 DIAGNOSIS — S161XXA Strain of muscle, fascia and tendon at neck level, initial encounter: Secondary | ICD-10-CM | POA: Diagnosis not present

## 2019-01-14 NOTE — ED Triage Notes (Signed)
Pt present she fall 01/06/19. She slipped on some ice and fall on her computer back. Pt  C/o chest, rib. Neck pain on the right side.

## 2019-01-14 NOTE — ED Provider Notes (Signed)
[place]   [id number] 01/14/19 Arrival Time: 1003  ASSESSMENT & PLAN:  1. Closed fracture of multiple ribs of right side, initial encounter   2. Acute strain of neck muscle, initial encounter    I have personally viewed the imaging studies ordered this visit. No pneumothorax. Subtle R 5th and 6th rib fractures.This is where she is tender. No indication for cervical imaging. Discussed.  Declines stronger medication for pain. Prefers OTC. Discussed importance of regularly taking deep breaths. Discussed typical healing time for rib fractures. Written information given on AVS.   Discharge Instructions     You may use over the counter ibuprofen or acetaminophen as needed.      Follow-up Information    Briscoe Deutscher, DO.   Specialty:  Family Medicine Why:  As needed. Contact information: [place] [address] [phone]        [facility].   Specialty:  Urgent Care Why:  If symptoms worsen. Contact information: [place] [phone]         Reviewed expectations re: course of current medical issues. Questions answered. Outlined signs and symptoms indicating need for more acute intervention. Patient verbalized understanding. After Visit Summary given.  SUBJECTIVE: History from: patient. Caitlyn Page is a 53 y.o. female who reports persistent moderate pain of her right anterior chest wall and side of chest; described as aching without radiation. No back pain reported. Mild neck "soreness on the right"; improving. Onset: abrupt, about one week ago. Injury/trama: yes, reports falling forward and landing on a full computer bag; immediate pain of areas listed above. Symptoms have progressed to a point and plateaued since beginning. Aggravating factors: movement. Alleviating factors: rest. Associated symptoms: none reported. Extremity sensation changes or  weakness: none. Self treatment: has not tried OTCs for relief of pain. History of similar: no No respiratory difficulties reported.  Ambulatory without difficulty.  Past Surgical History:  Procedure Laterality Date  . sinus surgery   2000  . TONSILLECTOMY AND ADENOIDECTOMY     Sometime in the 1980's     ROS: As per HPI. All other systems negative.   OBJECTIVE:  Vitals:   01/14/19 1030  BP: 118/73  Pulse: 76  Resp: 16  Temp: 99 F (37.2 C)  TempSrc: Oral  SpO2: 99%    General appearance: alert; no distress HEENT: ; AT Neck: supple with FROM; no midline tenderness; very mild tenderness over R posterior neck musculature Chest Wall: tender over R anterior chest extending around to side of chest; poorly localized; without bruising or swelling Lungs: CTAB; unlabored respirations CV: RRR without murmer Extremities: moving all extremities normally Skin: warm and dry; no visible rashes; no bruising or signs of trauma appreciated Neurologic: gait normal Psychological: alert and cooperative; normal mood and affect  Imaging: Dg Ribs Unilateral W/chest Right  Result Date: 01/14/2019 CLINICAL DATA:  Acute RIGHT chest and rib pain following fall 1 week ago. Initial encounter. EXAM: RIGHT RIBS AND CHEST - 3+ VIEW COMPARISON:  None. FINDINGS: Fractures of the RIGHT 5th and 6th ribs have a remote appearance but correlate clinically. No other rib abnormalities identified. Cardiomediastinal silhouette is unremarkable except for large hiatal hernia. Lungs are clear.  No pleural effusion or pneumothorax. IMPRESSION: RIGHT 5th and 6th rib fractures, appear remote but correlate clinically. No other acute abnormalities noted. Large hiatal hernia. Electronically Signed   By: Margarette Canada M.D.   On: 01/14/2019 11:44  Allergies  Allergen Reactions  . Fructose     "probably"  . Gluten Meal     "maybe"    Past Medical History:  Diagnosis Date  . Chronic kidney disease   . Depression   .  Frequent headaches   . Prediabetes   . Right foot pain   . Sinus problem   . Thyroid disease   . Vision problems    Social History   Socioeconomic History  . Marital status: Single    Spouse name: Not on file  . Number of children: 0  . Years of education: Not on file  . Highest education level: Not on file  Occupational History  . Not on file  Social Needs  . Financial resource strain: Not on file  . Food insecurity:    Worry: Not on file    Inability: Not on file  . Transportation needs:    Medical: Not on file    Non-medical: Not on file  Tobacco Use  . Smoking status: Never Smoker  . Smokeless tobacco: Never Used  Substance and Sexual Activity  . Alcohol use: No  . Drug use: No  . Sexual activity: Not on file  Lifestyle  . Physical activity:    Days per week: Not on file    Minutes per session: Not on file  . Stress: Not on file  Relationships  . Social connections:    Talks on phone: Not on file    Gets together: Not on file    Attends religious service: Not on file    Active member of club or organization: Not on file    Attends meetings of clubs or organizations: Not on file    Relationship status: Not on file  Other Topics Concern  . Not on file  Social History Narrative   Lives at home alone    Right handed   Caffeine: a few times per week   Family History  Problem Relation Age of Onset  . Breast cancer Mother   . Aneurysm Mother   . Heart Problems Father   . Headache Sister   . Other Sister        vertigo   Past Surgical History:  Procedure Laterality Date  . sinus surgery   2000  . TONSILLECTOMY AND ADENOIDECTOMY     Sometime in the 1980's      Vanessa Kick, MD 01/14/19 1240

## 2019-01-14 NOTE — Discharge Instructions (Addendum)
You may use over the counter ibuprofen or acetaminophen as needed.  ° °

## 2019-03-17 ENCOUNTER — Ambulatory Visit: Payer: Managed Care, Other (non HMO) | Admitting: Physician Assistant

## 2019-03-29 ENCOUNTER — Other Ambulatory Visit: Payer: Self-pay | Admitting: Dermatology

## 2019-04-06 ENCOUNTER — Ambulatory Visit (INDEPENDENT_AMBULATORY_CARE_PROVIDER_SITE_OTHER): Payer: Managed Care, Other (non HMO) | Admitting: Physician Assistant

## 2019-04-06 ENCOUNTER — Telehealth: Payer: Self-pay | Admitting: Physician Assistant

## 2019-04-06 ENCOUNTER — Encounter: Payer: Self-pay | Admitting: Physician Assistant

## 2019-04-06 ENCOUNTER — Other Ambulatory Visit: Payer: Managed Care, Other (non HMO)

## 2019-04-06 ENCOUNTER — Telehealth: Payer: Self-pay | Admitting: *Deleted

## 2019-04-06 VITALS — Temp 99.2°F | Ht 64.0 in | Wt 190.0 lb

## 2019-04-06 DIAGNOSIS — R059 Cough, unspecified: Secondary | ICD-10-CM

## 2019-04-06 DIAGNOSIS — R1310 Dysphagia, unspecified: Secondary | ICD-10-CM | POA: Diagnosis not present

## 2019-04-06 DIAGNOSIS — Z8711 Personal history of peptic ulcer disease: Secondary | ICD-10-CM | POA: Diagnosis not present

## 2019-04-06 DIAGNOSIS — K449 Diaphragmatic hernia without obstruction or gangrene: Secondary | ICD-10-CM | POA: Diagnosis not present

## 2019-04-06 DIAGNOSIS — R05 Cough: Secondary | ICD-10-CM | POA: Diagnosis not present

## 2019-04-06 DIAGNOSIS — Z20822 Contact with and (suspected) exposure to covid-19: Secondary | ICD-10-CM

## 2019-04-06 MED ORDER — AZITHROMYCIN 250 MG PO TABS: ORAL_TABLET | ORAL | 0 refills | Status: DC

## 2019-04-06 NOTE — Telephone Encounter (Signed)
Patient needs COVID-19 testing.  Please call and schedule ASAP.  Inda Coke PA-C 04/06/19

## 2019-04-06 NOTE — Telephone Encounter (Signed)
PCP is requesting testing for patient due to symptoms and exposure. Patient called and she has been scheduled.

## 2019-04-06 NOTE — Progress Notes (Signed)
Virtual Visit via Video   I connected with Caitlyn Page on 04/06/19 at  9:40 AM EDT by a video enabled telemedicine application and verified that I am speaking with the correct person using two identifiers. Location patient: Home Location provider: [facility], Office Persons participating in the virtual visit: Terrance, Lanahan PA-C, Anselmo Pickler, LPN   I discussed the limitations of evaluation and management by telemedicine and the availability of in person appointments. The patient expressed understanding and agreed to proceed.  I acted as a Education administrator for [employer], PA-C [employer], LPN  Subjective:   HPI:   Patient with multiple issues to be addressed:  History of PUD/Hiatal Hernia/Difficulty Swallowing Patient reports that she has history of PUD and thinks that he stomach ulcer is returning. She has been drinking more coffee recently. She denies excessive NSAID use. She takes several medicinal herbs, such as aloe for her symptoms, however she is having breakthrough symptoms with her regular regimen. She states that on a daily basis she has to vomit at the least meal of the day due to "excessive mucus" and difficulty swallowing. A "large hiatal hernia" was found on her xray in Feb 2020 when she was xrayed for a rib fracture. She doesn't like to take daily medications for heartburn if they are just a temporary solution.  URI Symptoms She reports intermittent tightness in her chest x 1 month. She had prior rib fracture in Feb and thinks that she is still healing from that. The area feels tender to touch and is not worse with activity. She has no radiation of symptoms. She has been monitoring her temperature and the max has been around 99.8. Her partner was recently "tested for antibodies" and was positive, found out about 2 days ago. He was asymptomatic at the time but is now having diarrhea. She admits to having some intermittent loose stools. She has some cough and  congestion, sinus pressure. She has hx of walking PNA, several years ago. Has tried sinus medication without relief. 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 Denies SOB or chest pain.  ROS: See pertinent positives and negatives per HPI.  Patient Active Problem List   Diagnosis Date Noted  . Vertigo 12/23/2018  . Vestibular migraine 12/23/2018  . Insulin resistance 10/12/2017  . Anxiety 10/12/2017  . History of peptic ulcer disease 09/08/2017  . Hypothyroidism 08/16/2017    Social History   Tobacco Use  . Smoking status: Never Smoker  . Smokeless tobacco: Never Used  Substance Use Topics  . Alcohol use: No    Current Outpatient Medications:  .  Ascorbic Acid (VITAMIN C PO), Take by mouth., Disp: , Rfl:  .  Calcium-Magnesium-Zinc (CAL-MAG-ZINC PO), Take by mouth., Disp: , Rfl:  .  Cholecalciferol (VITAMIN D3 PO), Take by mouth., Disp: , Rfl:  .  Coenzyme Q10 (CO Q 10 PO), Take by mouth., Disp: , Rfl:  .  Omega-3 Fatty Acids (FISH OIL PO), Take by mouth., Disp: , Rfl:  .  Saw Palmetto, Serenoa repens, (SAW PALMETTO PO), Take by mouth., Disp: , Rfl:  .  SYNTHROID 50 MCG tablet, Take 1 tablet (50 mcg total) by mouth daily., Disp: 90 tablet, Rfl: 1 .  TURMERIC PO, Take by mouth., Disp: , Rfl:  .  azithromycin (ZITHROMAX) 250 MG tablet, Take two tablets on day 1, then one tablet daily thereafter, Disp: 6  tablet, Rfl: 0  Allergies  Allergen Reactions  . Fructose     "probably"  . Gluten Meal     "maybe"    Objective:   VITALS: Per patient if applicable, see vitals. GENERAL: Alert, appears well and in no acute distress. HEENT: Atraumatic, conjunctiva clear, no obvious abnormalities on inspection of external nose and ears. NECK: Normal movements of the head and neck. CARDIOPULMONARY: No increased WOB. Speaking in clear sentences. I:E ratio WNL.  MS: Moves all visible extremities  without noticeable abnormality. PSYCH: Pleasant and cooperative, well-groomed. Speech normal rate and rhythm. Affect is appropriate. Insight and judgement are appropriate. Attention is focused, linear, and appropriate.  NEURO: CN grossly intact. Oriented as arrived to appointment on time with no prompting. Moves both UE equally.  SKIN: No obvious lesions, wounds, erythema, or cyanosis noted on face or hands.  Assessment and Plan:   Ezmeralda was seen today for requesting covid-19 testing.  Diagnoses and all orders for this visit:  Hiatal hernia; History of peptic ulcer disease; Dysphagia, unspecified type Referral to GI per patient request. Recommended bland diet, avoid triggers. She is not interested in medication at this time. -     Ambulatory referral to Gastroenterology  Cough Low threshold for any worsening symptoms to be evaluated with CXR at urgent care or ER -- patient verbalized understanding. Will send for COVID-19 testing. I have also given her a rx for azithromycin for sinusitis and her cough. Discussed with her that I cannot r/o PNA with her symptoms. Discussed taking medications as prescribed. Reviewed worsening precautions including worsening fever, SOB, worsening cough or other concerns. Push fluids and rest. I recommend that patient follow-up if symptoms worsen or persist despite treatment x 7-10 days, sooner if needed.  Other orders -     azithromycin (ZITHROMAX) 250 MG tablet; Take two tablets on day 1, then one tablet daily thereafter   . Reviewed expectations re: course of current medical issues. . Discussed self-management of symptoms. . Outlined signs and symptoms indicating need for more acute intervention. . Patient verbalized understanding and all questions were answered. Marland Kitchen Health Maintenance issues including appropriate healthy diet, exercise, and smoking avoidance were discussed with patient. . See orders for this visit as documented in the electronic medical record.   I discussed the assessment and treatment plan with the patient. The patient was provided an opportunity to ask questions and all were answered. The patient agreed with the plan and demonstrated an understanding of the instructions.   The patient was advised to call back or seek an in-person evaluation if the symptoms worsen or if the condition fails to improve as anticipated.   CMA or LPN served as scribe during this visit. History, Physical, and Plan performed by medical provider. The above documentation has been reviewed and is accurate and complete.   [place], [place] 04/06/2019

## 2019-04-07 ENCOUNTER — Encounter: Payer: Self-pay | Admitting: Family Medicine

## 2019-04-09 LAB — NOVEL CORONAVIRUS, NAA: SARS-CoV-2, NAA: NOT DETECTED

## 2019-04-13 ENCOUNTER — Telehealth: Payer: Self-pay

## 2019-04-13 NOTE — Telephone Encounter (Signed)
Notes recorded by Matilde Sprang, RN on 04/11/2019 at 1:38 PM EDT Patient given results as noted, she verbalized understanding. Patient says when she was tested, the person only put the swab around in her nostrils, not all the way up in the sinuses, she says the literature says that's not the correct way to do the test and asked what does she need to do. I advised someone from the office will call her back with the recommendation, she verbalized understanding.

## 2019-04-14 NOTE — Telephone Encounter (Signed)
Called the number for scheduling they were not sure of who could help answer questions for patient. She will check with her supervisor and return my call. I have called patient back and gave her all information. She did have questions about antibody testing. Answered all questions regarding that as well. She did not want to make app for that lab at this time has appointment with GI and wants to see if they are going to need any blood work as well. Patient was informed that we do not recommend that she have testing until symptom free for 14 days.   Patient informed that I will call with any information about the type of test she had done once information has been received.

## 2019-04-14 NOTE — Telephone Encounter (Signed)
For you -

## 2019-04-14 NOTE — Telephone Encounter (Signed)
Please call patient.  Please explain that [facility] staff is well-trained and following certain protocols for these swabs.  Different swabs require different samples and the collection process varies based on type of test.  Please provide number for testing site if she has additional questions.

## 2019-04-14 NOTE — Telephone Encounter (Signed)
Thanks for the update

## 2019-04-14 NOTE — Telephone Encounter (Signed)
received call back from Covid coordinator she e mailed me a copy of the test instructions that are given to patient at time of testing that explain that is only done inside of nose. Use only the tip of the swab. I have called patient to let her know as well as mailed copy of information to home address.

## 2019-04-21 ENCOUNTER — Encounter: Payer: Self-pay | Admitting: Gastroenterology

## 2019-04-21 ENCOUNTER — Ambulatory Visit (INDEPENDENT_AMBULATORY_CARE_PROVIDER_SITE_OTHER): Payer: Managed Care, Other (non HMO) | Admitting: Gastroenterology

## 2019-04-21 ENCOUNTER — Other Ambulatory Visit: Payer: Self-pay

## 2019-04-21 VITALS — Ht 64.0 in | Wt 190.0 lb

## 2019-04-21 DIAGNOSIS — K219 Gastro-esophageal reflux disease without esophagitis: Secondary | ICD-10-CM

## 2019-04-21 DIAGNOSIS — K449 Diaphragmatic hernia without obstruction or gangrene: Secondary | ICD-10-CM

## 2019-04-21 DIAGNOSIS — R1310 Dysphagia, unspecified: Secondary | ICD-10-CM | POA: Diagnosis not present

## 2019-04-21 MED ORDER — PANTOPRAZOLE SODIUM 40 MG PO TBEC: 40.0000 mg | DELAYED_RELEASE_TABLET | Freq: Every day | ORAL | 3 refills | Status: DC

## 2019-04-21 NOTE — Patient Instructions (Addendum)
I recommend an Upper GI Series (UGI)  to further evaluate your esophagus and hiatal hernia.   We will plan an EGD after the UGI series.  We will plan a screening colonoscopy at the same time. Per your request, we did not schedule the procedure(s) today. Please contact our office at [phone] should you decide to have the procedure completed.   Please start taking pantoprazole 40 mg daily.   Please work to maintain a health weight. You might find this helps your symptoms even more than the medications.   Lifestyle and dietary modifications hat I recommend for reflux include: Take your pantoprazole 30 minutes before meals Avoid any dietary triggers that you have identified Avoid spicy and acidic foods Limit your intake of coffee, tea, alcohol, and carbonated drinks Work to maintain a healthy weight Keep the head of the bed elevated with blocks if you are having any nighttime symptoms Stay upright for 2 hours after eating Avoid meals and snacks three to four hours before bedtime  I recommend UpToDate.com where there is some excellent patient information: Hiatal hernia (The Basics)  Thank you for your patience with me and our technology today! Please stay home, safe, and healthy. I look forward to meeting you in person in the future.

## 2019-04-21 NOTE — Progress Notes (Signed)
TELEHEALTH VISIT  Referring Provider: Inda Coke, PA Primary Care Physician:  Briscoe Deutscher, DO   Tele-visit due to COVID-19 pandemic Patient requested visit virtually, consented to the virtual encounter via video enabled telemedicine application (Zoom) Contact made at: 10:32 04/21/19 Patient verified by name and date of birth Location of patient: Home Location provider: [facility] Names of persons participating: Me, patient, Tinnie Gens CMA Time spent on telehealth visit: 38 minutes I discussed the limitations of evaluation and management by telemedicine. The patient expressed understanding and agreed to proceed.  Reason for Consultation:  Hiatal hernia   IMPRESSION:  Dysphagia to solids Reflux Large hiatal hernia seen on chest x-ray with attention to ribs 01/14/19 BMI 32 Chronic candida survivor Wants to avoid medications Prior endoscopic evaluation in [place] No known family history of colon cancer or polyps  Reviewed the diagnosis and natural history of a hiatal hernia. Management centers around controlling GERD. Repair of an asymptomatic hernia is not indicated.  Surgery would be considered for potential complications of a paraesophageal hernia such as gastric volvulus, bleeding, obstruction, strangulation, perforation, and associated respiratory compromise.     PLAN: Pantoprazole 40 mg daily Weight loss recommended given the temporal association of weight gain with symptoms Reviewed GERD lifestyle modifications UGI series to clarify her anatomy EGD after the UGI series has been reviewed Colonoscopy Directed to the patient education section of UpToDate for more information about hiatal hernias  I consented the patient discussing the risks, benefits, and alternatives to endoscopic evaluation. In particular, we discussed the risks that include, but are not limited to, reaction to medication, cardiopulmonary compromise, bleeding requiring blood  transfusion, aspiration resulting in pneumonia, perforation requiring surgery, lack of diagnosis, severe illness requiring hospitalization, and even death. We reviewed the risk of missed lesion including polyps or even cancer. The patient acknowledges these risks and asks that we proceed.   HPI: Caitlyn Page is a 53 y.o. female seen for a hiatal hernia. She identifies herself as a chronic candida survivor. Has a history of hypothyroidism, anxiety, insulin resistance, vestibular migraine, vertigo, and peptic ulcer disease.   Large hiatal hernia seen on x-rays in the ED performed when evaluating closed fracture of multiple ribs on the right side 01/14/19 after a fall. She saw the finding on the x-ray report. The doctor never mentioned it before and it has her concerned.  She was diagnosed with a hernia 7-8 years ago in [place] during the evaluation of peptic ulcer disease. Cured with fasting regimen and an all vegetable diet.  She had an EGD with balloon dilation and a colonoscopy at that time. She has been unable to obtain those results and cannot provide full details.  No GI symptoms since that time until last year. Very stressful work. Started intermittent fasting and had her weight down to 170. Was avoiding all gluten and sugar. She was losing weight but she felt that her stomach was still protruding out of proportion to this "lump" in her chest.   Will use Prilosec or omeprazole if things are really bad. She doesn't want to be on any long term medications.   Developed constipation last year. Become more dificult while on intermittent fasting.  Has gained 35 pounds over the last year. She is now having difficulty with "mucous in her esophagus." Frequent dysphagia at the level of the sternum. Can hit her chest or relax to pass the food bolus. Frequent regurgitation.  No odynophagia, sore throat, blood in the stool, change in bowel habits,  known anemia, or dysphonia.   No known family history of colon  cancer or polyps. No family history of uterine/endometrial cancer, pancreatic cancer or gastric/stomach cancer.  Past Medical History:  Diagnosis Date  . Chronic kidney disease   . Depression   . Frequent headaches   . Prediabetes   . Right foot pain   . Sinus problem   . Thyroid disease   . Vision problems     Past Surgical History:  Procedure Laterality Date  . sinus surgery   2000  . TONSILLECTOMY AND ADENOIDECTOMY     Sometime in the 1980's    Current Outpatient Medications  Medication Sig Dispense Refill  . Ascorbic Acid (VITAMIN C PO) Take by mouth.    Marland Kitchen azithromycin (ZITHROMAX) 250 MG tablet Take two tablets on day 1, then one tablet daily thereafter 6 tablet 0  . Calcium-Magnesium-Zinc (CAL-MAG-ZINC PO) Take by mouth.    . Cholecalciferol (VITAMIN D3 PO) Take by mouth.    . Coenzyme Q10 (CO Q 10 PO) Take by mouth.    . Omega-3 Fatty Acids (FISH OIL PO) Take by mouth.    . Saw Palmetto, Serenoa repens, (SAW PALMETTO PO) Take by mouth.    . SYNTHROID 50 MCG tablet Take 1 tablet (50 mcg total) by mouth daily. 90 tablet 1  . TURMERIC PO Take by mouth.     No current facility-administered medications for this visit.     Allergies as of 04/21/2019 - Review Complete 04/21/2019  Allergen Reaction Noted  . Fructose  12/21/2018  . Gluten meal  12/21/2018    Family History  Problem Relation Age of Onset  . Breast cancer Mother   . Aneurysm Mother   . Heart Problems Father   . Headache Sister   . Other Sister        vertigo    Social History   Socioeconomic History  . Marital status: Single    Spouse name: Not on file  . Number of children: 0  . Years of education: Not on file  . Highest education level: Not on file  Occupational History  . Not on file  Social Needs  . Financial resource strain: Not on file  . Food insecurity:    Worry: Not on file    Inability: Not on file  . Transportation needs:    Medical: Not on file    Non-medical: Not on file   Tobacco Use  . Smoking status: Never Smoker  . Smokeless tobacco: Never Used  Substance and Sexual Activity  . Alcohol use: No  . Drug use: No  . Sexual activity: Not on file  Lifestyle  . Physical activity:    Days per week: Not on file    Minutes per session: Not on file  . Stress: Not on file  Relationships  . Social connections:    Talks on phone: Not on file    Gets together: Not on file    Attends religious service: Not on file    Active member of club or organization: Not on file    Attends meetings of clubs or organizations: Not on file    Relationship status: Not on file  . Intimate partner violence:    Fear of current or ex partner: Not on file    Emotionally abused: Not on file    Physically abused: Not on file    Forced sexual activity: Not on file  Other Topics Concern  . Not on file  Social History Narrative   Lives at home alone    Right handed   Caffeine: a few times per week    Review of Systems: ALL ROS discussed and all others negative except listed in HPI.  Physical Exam: General: in no acute distress Neuro: Alert and appropriate Psych: Normal affect and normal insight   Flower Franko L. Tarri Glenn, MD, MPH [facility] 04/21/2019, 10:28 AM

## 2019-05-18 ENCOUNTER — Encounter: Payer: Self-pay | Admitting: Gastroenterology

## 2019-06-23 ENCOUNTER — Other Ambulatory Visit: Payer: Self-pay

## 2019-06-23 ENCOUNTER — Ambulatory Visit (AMBULATORY_SURGERY_CENTER): Payer: Self-pay

## 2019-06-23 VITALS — Ht 64.0 in | Wt 205.0 lb

## 2019-06-23 DIAGNOSIS — Z1211 Encounter for screening for malignant neoplasm of colon: Secondary | ICD-10-CM

## 2019-06-23 DIAGNOSIS — R1310 Dysphagia, unspecified: Secondary | ICD-10-CM

## 2019-06-23 MED ORDER — NA SULFATE-K SULFATE-MG SULF 17.5-3.13-1.6 GM/177ML PO SOLN: 1.0000 | Freq: Once | ORAL | 0 refills | Status: AC

## 2019-06-23 NOTE — Progress Notes (Signed)
Denies allergies to eggs or soy products. Denies complication of anesthesia or sedation. Denies use of weight loss medication. Denies use of O2.   Emmi instructions given for colonoscopy.  Pre-Visit was conducted by phone due to Covid 19. Instructions were reviewed with patient and mailed to confirmed home address. A 15.00 coupon for Suprep was given to the patient. Patient was encouraged to call if she had any questions regarding instructions.

## 2019-07-07 ENCOUNTER — Telehealth: Payer: Self-pay

## 2019-07-07 ENCOUNTER — Encounter: Payer: Self-pay | Admitting: *Deleted

## 2019-07-07 ENCOUNTER — Encounter: Payer: Self-pay | Admitting: Gastroenterology

## 2019-07-07 ENCOUNTER — Other Ambulatory Visit: Payer: Self-pay | Admitting: *Deleted

## 2019-07-07 ENCOUNTER — Telehealth: Payer: Self-pay | Admitting: *Deleted

## 2019-07-07 ENCOUNTER — Other Ambulatory Visit: Payer: Self-pay

## 2019-07-07 ENCOUNTER — Ambulatory Visit (AMBULATORY_SURGERY_CENTER): Payer: Managed Care, Other (non HMO) | Admitting: Gastroenterology

## 2019-07-07 VITALS — BP 110/70 | HR 62 | Temp 97.5°F | Resp 15 | Ht 64.0 in | Wt 190.0 lb

## 2019-07-07 DIAGNOSIS — R1310 Dysphagia, unspecified: Secondary | ICD-10-CM

## 2019-07-07 DIAGNOSIS — K635 Polyp of colon: Secondary | ICD-10-CM | POA: Diagnosis not present

## 2019-07-07 DIAGNOSIS — D123 Benign neoplasm of transverse colon: Secondary | ICD-10-CM

## 2019-07-07 DIAGNOSIS — K219 Gastro-esophageal reflux disease without esophagitis: Secondary | ICD-10-CM

## 2019-07-07 DIAGNOSIS — K449 Diaphragmatic hernia without obstruction or gangrene: Secondary | ICD-10-CM

## 2019-07-07 DIAGNOSIS — Z1211 Encounter for screening for malignant neoplasm of colon: Secondary | ICD-10-CM

## 2019-07-07 DIAGNOSIS — D124 Benign neoplasm of descending colon: Secondary | ICD-10-CM

## 2019-07-07 MED ORDER — SODIUM CHLORIDE 0.9 % IV SOLN: 500.0000 mL | Freq: Once | INTRAVENOUS | Status: DC

## 2019-07-07 NOTE — Telephone Encounter (Signed)
Entered in error

## 2019-07-07 NOTE — Patient Instructions (Addendum)
Read all of the handouts given to you by your recovery room nurse.  The office will schedule your barium esophagram, and they will call you.  YOU HAD AN ENDOSCOPIC PROCEDURE TODAY AT [facility]:   Refer to the procedure report that was given to you for any specific questions about what was found during the examination.  If the procedure report does not answer your questions, please call your gastroenterologist to clarify.  If you requested that your care partner not be given the details of your procedure findings, then the procedure report has been included in a sealed envelope for you to review at your convenience later.  YOU SHOULD EXPECT: Some feelings of bloating in the abdomen. Passage of more gas than usual.  Walking can help get rid of the air that was put into your GI tract during the procedure and reduce the bloating. If you had a lower endoscopy (such as a colonoscopy or flexible sigmoidoscopy) you may notice spotting of blood in your stool or on the toilet paper. If you underwent a bowel prep for your procedure, you may not have a normal bowel movement for a few days.  Please Note:  You might notice some irritation and congestion in your nose or some drainage.  This is from the oxygen used during your procedure.  There is no need for concern and it should clear up in a day or so.  SYMPTOMS TO REPORT IMMEDIATELY:   Following lower endoscopy (colonoscopy or flexible sigmoidoscopy):  Excessive amounts of blood in the stool  Significant tenderness or worsening of abdominal pains  Swelling of the abdomen that is new, acute  Fever of 100F or higher   Following upper endoscopy (EGD)  Vomiting of blood or coffee ground material  New chest pain or pain under the shoulder blades  Painful or persistently difficult swallowing  New shortness of breath  Fever of 100F or higher  Black, tarry-looking stools  For urgent or emergent issues, a gastroenterologist can be reached  at any hour by calling [phone].   DIET:  We do recommend a small meal at first, but then you may proceed to your regular diet.  Drink plenty of fluids but you should avoid alcoholic beverages for 24 hours.  ACTIVITY:  You should plan to take it easy for the rest of today and you should NOT DRIVE or use heavy machinery until tomorrow (because of the sedation medicines used during the test).    FOLLOW UP: Our staff will call the number listed on your records 48-72 hours following your procedure to check on you and address any questions or concerns that you may have regarding the information given to you following your procedure. If we do not reach you, we will leave a message.  We will attempt to reach you two times.  During this call, we will ask if you have developed any symptoms of COVID 19. If you develop any symptoms (ie: fever, flu-like symptoms, shortness of breath, cough etc.) before then, please call [phone].  If you test positive for Covid 19 in the 2 weeks post procedure, please call and report this information to [place].    If any biopsies were taken you will be contacted by phone or by letter within the next 1-3 weeks.  Please call us at [phone] if you have not heard about the biopsies in 3 weeks.    SIGNATURES/CONFIDENTIALITY: You and/or your care partner have signed paperwork which will be entered  into your electronic medical record.  These signatures attest to the fact that that the information above on your After Visit Summary has been reviewed and is understood.  Full responsibility of the confidentiality of this discharge information lies with you and/or your care-partner.

## 2019-07-07 NOTE — Op Note (Signed)
[facility] Patient Name: Caitlyn Page Procedure Date: 07/07/2019 9:51 AM MRN: [id number] Endoscopist: Thornton Park MD, MD Age: 53 Referring MD:  Date of Birth: 1965/12/25 Gender: Female Account #: [id number] Procedure:                Colonoscopy Indications:              Screening for colorectal malignant neoplasm                           Prior endoscopic evaluation in [place]                           No known family history of colon cancer or polyps Medicines:                See the Anesthesia note for documentation of the                            administered medications Procedure:                Pre-Anesthesia Assessment:                           - Prior to the procedure, a History and Physical                            was performed, and patient medications and                            allergies were reviewed. The patient's tolerance of                            previous anesthesia was also reviewed. The risks                            and benefits of the procedure and the sedation                            options and risks were discussed with the patient.                            All questions were answered, and informed consent                            was obtained. Prior Anticoagulants: The patient has                            taken no previous anticoagulant or antiplatelet                            agents. ASA Grade Assessment: II - A patient with                            mild systemic disease. After reviewing the risks  and benefits, the patient was deemed in                            satisfactory condition to undergo the procedure.                           After obtaining informed consent, the colonoscope                            was passed under direct vision. Throughout the                            procedure, the patient's blood pressure, pulse, and                            oxygen saturations were  monitored continuously. The                            Colonoscope was introduced through the anus and                            advanced to the the terminal ileum, with                            identification of the appendiceal orifice and IC                            valve. A second forward view of the right colon was                            performed. The colonoscopy was performed without                            difficulty. The patient tolerated the procedure                            well. The quality of the bowel preparation was                            good. The terminal ileum, ileocecal valve,                            appendiceal orifice, and rectum were photographed. Scope In: 10:13:32 AM Scope Out: 10:30:14 AM Scope Withdrawal Time: 0 hours 14 minutes 20 seconds  Total Procedure Duration: 0 hours 16 minutes 42 seconds  Findings:                 The perianal and digital rectal examinations were                            normal.                           A 3 mm polyp was found in the transverse colon. The  polyp was flat. The polyp was removed with a                            piecemeal technique using a cold snare. Resection                            and retrieval were complete. Estimated blood loss                            was minimal.                           The entire examined colon appeared normal on direct                            and retroflexion views. Complications:            No immediate complications. Estimated blood loss:                            Minimal. Estimated Blood Loss:     Estimated blood loss was minimal. Impression:               - One 3 mm polyp in the transverse colon, removed                            piecemeal using a cold snare. Resected and                            retrieved.                           - The entire examined colon is normal on direct and                            retroflexion  views. Recommendation:           - Patient has a contact number available for                            emergencies. The signs and symptoms of potential                            delayed complications were discussed with the                            patient. Return to normal activities tomorrow.                            Written discharge instructions were provided to the                            patient.                           - Resume previous diet today.                           -  Continue present medications.                           - Await pathology results.                           - Repeat colonoscopy date to be determined after                            pending pathology results are reviewed for                            surveillance based on pathology results. Thornton Park MD, MD 07/07/2019 10:43:29 AM This report has been signed electronically.

## 2019-07-07 NOTE — Op Note (Signed)
[facility] Patient Name: Caitlyn Page Procedure Date: 07/07/2019 9:51 AM MRN: [id number] Endoscopist: Thornton Park MD, MD Age: 53 Referring MD:  Date of Birth: 06-19-1966 Gender: Female Account #: [id number] Procedure:                Upper GI endoscopy Indications:              Dysphagia, Suspected esophageal reflux, Nausea with                            vomiting                           Dysphagia to solids                           Reflux                           Large hiatal hernia seen on chest x-ray with                            attention to ribs 01/14/19 Medicines:                See the Anesthesia note for documentation of the                            administered medications Procedure:                Pre-Anesthesia Assessment:                           - Prior to the procedure, a History and Physical                            was performed, and patient medications and                            allergies were reviewed. The patient's tolerance of                            previous anesthesia was also reviewed. The risks                            and benefits of the procedure and the sedation                            options and risks were discussed with the patient.                            All questions were answered, and informed consent                            was obtained. Prior Anticoagulants: The patient has                            taken no previous anticoagulant or antiplatelet  agents. ASA Grade Assessment: II - A patient with                            mild systemic disease. After reviewing the risks                            and benefits, the patient was deemed in                            satisfactory condition to undergo the procedure.                           After obtaining informed consent, the endoscope was                            passed under direct vision. Throughout the      procedure, the patient's blood pressure, pulse, and                            oxygen saturations were monitored continuously. The                            Endoscope was introduced through the mouth, and                            advanced to the third part of duodenum. The upper                            GI endoscopy was accomplished without difficulty.                            The patient tolerated the procedure well. 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 Biopsies were                            taken with a cold forceps for histology from the                            mid/proximal and distal esophagus. Estimated blood                            loss was minimal.                           A large hiatal hernia was present.                           The entire examined stomach was normal. Biopsies  were taken with a cold forceps for histology from                            the antrum, body, and fundus. . Estimated blood                            loss was minimal.                           The examined duodenum was normal. Complications:            No immediate complications. Estimated blood loss:                            Minimal. Estimated Blood Loss:     Estimated blood loss was minimal. Impression:               - Tortuous esophagus.                           - Large hiatal hernia.                           - Normal stomach. Biopsied.                           - Normal examined duodenum. Recommendation:           - Patient has a contact number available for                            emergencies. The signs and symptoms of potential                            delayed complications were discussed with the                            patient. Return to normal activities tomorrow.                            Written discharge instructions  were provided to the                            patient.                           - Resume previous diet today.                           - Continue present medications.                           - Await pathology results.                           - Proceed with barium esophagram to further  evaluate the dysphagia. Thornton Park MD, MD 07/07/2019 10:38:47 AM This report has been signed electronically.

## 2019-07-07 NOTE — Telephone Encounter (Signed)
Patient answered "no" to all questions.  

## 2019-07-07 NOTE — Telephone Encounter (Signed)
Covid-19 screening questions   Do you now or have you had a fever in the last 14 days?  Do you have any respiratory symptoms of shortness of breath or cough now or in the last 14 days?  Do you have any family members or close contacts with diagnosed or suspected Covid-19 in the past 14 days?  Have you been tested for Covid-19 and found to be positive?      Unable to reach patient. Busy tone.

## 2019-07-07 NOTE — Progress Notes (Signed)
Pt's states no medical or surgical changes since previsit or office visit.  [name]  vital signs-courtney washington

## 2019-07-07 NOTE — Telephone Encounter (Signed)
Patient scheduled at [facility] on 8/31 at 10:00 am with a 9:45 am arrival time. NPO at 5:00 am. Patient aware.

## 2019-07-07 NOTE — Progress Notes (Signed)
Report to PACU, RN, vss, BBS= Clear.  

## 2019-07-07 NOTE — Telephone Encounter (Signed)
-----   Message from Thornton Park, MD sent at 07/07/2019 10:39 AM EDT ----- Needs barium esophagram to evaluation for dysphagia, possible presbyesophagus. Thanks.

## 2019-07-11 ENCOUNTER — Telehealth: Payer: Self-pay | Admitting: *Deleted

## 2019-07-11 NOTE — Telephone Encounter (Signed)
  Follow up Call-  Call back number 07/07/2019  Post procedure Call Back phone  # [phone]  Permission to leave phone message Yes  Some recent data might be hidden     Patient questions:  Do you have a fever, pain , or abdominal swelling? No. Pain Score  0 *  Have you tolerated food without any problems? Yes.    Have you been able to return to your normal activities? Yes.    Do you have any questions about your discharge instructions: Diet   No. Medications  No. Follow up visit  No.  Do you have questions or concerns about your Care? No.  Actions: * If pain score is 4 or above: No action needed, pain <4.  1. Have you developed a fever since your procedure? no  2.   Have you had an respiratory symptoms (SOB or cough) since your procedure? NO  3.   Have you tested positive for COVID 19 since your procedure NO  4.   Have you had any family members/close contacts diagnosed with the COVID 19 since your procedure?  NO   If yes to any of these questions please route to Joylene John, RN and Alphonsa Gin, RN.

## 2019-07-12 ENCOUNTER — Encounter: Payer: Self-pay | Admitting: Gastroenterology

## 2019-07-13 ENCOUNTER — Other Ambulatory Visit: Payer: Self-pay | Admitting: Family Medicine

## 2019-07-13 DIAGNOSIS — E039 Hypothyroidism, unspecified: Secondary | ICD-10-CM

## 2019-07-17 ENCOUNTER — Ambulatory Visit (HOSPITAL_COMMUNITY)
Admission: RE | Admit: 2019-07-17 | Discharge: 2019-07-17 | Disposition: A | Discharge status: Home / Self Care | Payer: Managed Care, Other (non HMO) | Source: Ambulatory Visit | Attending: Gastroenterology | Admitting: Gastroenterology

## 2019-07-17 ENCOUNTER — Other Ambulatory Visit: Payer: Self-pay

## 2019-07-17 DIAGNOSIS — R1310 Dysphagia, unspecified: Secondary | ICD-10-CM | POA: Diagnosis present

## 2019-07-19 ENCOUNTER — Encounter: Payer: Self-pay | Admitting: *Deleted

## 2019-08-22 ENCOUNTER — Ambulatory Visit: Payer: Managed Care, Other (non HMO) | Admitting: Podiatry

## 2019-10-15 ENCOUNTER — Ambulatory Visit (HOSPITAL_COMMUNITY)
Admission: EM | Admit: 2019-10-15 | Discharge: 2019-10-15 | Disposition: A | Discharge status: Home / Self Care | Payer: BC Managed Care – PPO

## 2019-10-15 ENCOUNTER — Encounter (HOSPITAL_COMMUNITY): Payer: Self-pay

## 2019-10-15 ENCOUNTER — Other Ambulatory Visit: Payer: Self-pay

## 2019-10-15 DIAGNOSIS — S29019A Strain of muscle and tendon of unspecified wall of thorax, initial encounter: Secondary | ICD-10-CM

## 2019-10-15 DIAGNOSIS — S39012A Strain of muscle, fascia and tendon of lower back, initial encounter: Secondary | ICD-10-CM

## 2019-10-15 MED ORDER — MELOXICAM 15 MG PO TABS: 15.0000 mg | ORAL_TABLET | Freq: Every day | ORAL | 0 refills | Status: AC

## 2019-10-15 MED ORDER — KETOROLAC TROMETHAMINE 60 MG/2ML IM SOLN
INTRAMUSCULAR | Status: AC
  Filled 2019-10-15: qty 2

## 2019-10-15 MED ORDER — HYDROCODONE-ACETAMINOPHEN 5-325 MG PO TABS: 1.0000 | ORAL_TABLET | ORAL | 0 refills | Status: DC | PRN

## 2019-10-15 MED ORDER — IBUPROFEN 800 MG PO TABS: 800.0000 mg | ORAL_TABLET | Freq: Three times a day (TID) | ORAL | 0 refills | Status: DC

## 2019-10-15 MED ORDER — METHYLPREDNISOLONE SODIUM SUCC 125 MG IJ SOLR
INTRAMUSCULAR | Status: AC
  Filled 2019-10-15: qty 2

## 2019-10-15 MED ORDER — PREDNISONE 20 MG PO TABS: 40.0000 mg | ORAL_TABLET | Freq: Every day | ORAL | 0 refills | Status: AC

## 2019-10-15 MED ORDER — METHYLPREDNISOLONE SODIUM SUCC 125 MG IJ SOLR
80.0000 mg | Freq: Once | INTRAMUSCULAR | Status: AC
  Administered 2019-10-15: 80 mg via INTRAMUSCULAR

## 2019-10-15 MED ORDER — KETOROLAC TROMETHAMINE 60 MG/2ML IM SOLN
60.0000 mg | Freq: Once | INTRAMUSCULAR | Status: AC
  Administered 2019-10-15: 60 mg via INTRAMUSCULAR

## 2019-10-15 NOTE — ED Triage Notes (Signed)
Pt presents to the [facility] with back pain and hip pain. Pt states she was jumping 1 week ago and felt something pop. Pt When to her PCP 6 days ago, she put the pt on mussel relaxer and told the pt to visit a chiropractor. Pt states after the chiropractor visit the pain is gradually worsened.

## 2019-10-15 NOTE — Discharge Instructions (Signed)
Continue to take the muscle relaxer as prescribed by your doctor  Take the medications that I have for prescribed for you as well  Rest your injured muscles for at least 5 days. NO lifting more than 10 pounds. Limit pulling, climbing, bending or extensive over head reaching activities.  Alternate between ice and heat to affected areas at least three times a day for 15-20 minutes each time.  You should start feeling better in the next couple of days. Recovery from muscle injuries generally takes 1-2 weeks. Complete healing normally takes 5-6 weeks. Follow-up with ortho if no improvement in symptoms   Take Care!  Aldona Bar, FNP-C

## 2019-10-15 NOTE — ED Provider Notes (Signed)
Caitlyn Page    CSN: [id number] Arrival date & time: 10/15/19  1009      History   Chief Complaint Chief Complaint  Patient presents with  . Back Injury    HPI Caitlyn Page is a 53 y.o. female.   Subjective:  Caitlyn Page is a 53 y.o. female who presents for evaluation of low back pain. The patient has had no prior back problems. Symptoms have been present for 1 week and are rapidly worsening.  Onset was related to / precipitated by a remote injury. The pain is located in the across the lower back and right thoracic area. The pain is described as aching, soreness and throbbing and occurs all day. She rates her pain as a 10 on a scale of 0-10. Symptoms are exacerbated by extension, flexion, lying down, standing and walking. Symptoms are improved by nothing. She has also tried acetaminophen, change in body position, chiropractic manipulation (3-4 times last week), muscle relaxants (prescribed by her PCP several days ago) and rest which provided no symptom relief. She denies any leg weakness, tingling in the right leg or burning pain. No urinary incontinence, bowel incontinence, groin/perineal numbness, dysuria or hematuria associated with the back pain. The patient has no "red flag" history indicative of complicated back pain.  The following portions of the patient's history were reviewed and updated as appropriate: allergies, current medications, past family history, past medical history, past social history, past surgical history and problem list.        Past Medical History:  Diagnosis Date  . Allergy   . Anxiety   . Arthritis   . Chronic kidney disease   . Depression   . Frequent headaches   . GERD (gastroesophageal reflux disease)   . Prediabetes   . Right foot pain   . Sinus problem   . Thyroid disease   . Vision problems     Patient Active Problem List   Diagnosis Date Noted  . Vertigo 12/23/2018  . Vestibular migraine 12/23/2018  . Insulin  resistance 10/12/2017  . Anxiety 10/12/2017  . History of peptic ulcer disease 09/08/2017  . Hypothyroidism 08/16/2017    Past Surgical History:  Procedure Laterality Date  . sinus surgery   2000  . TONSILLECTOMY AND ADENOIDECTOMY     Sometime in the 1980's    OB History   No obstetric history on file.      Home Medications    Prior to Admission medications   Medication Sig Start Date End Date Taking? Authorizing Provider  thyroid (NP THYROID) 30 MG tablet Take 30 mg by mouth daily before breakfast.   Yes [provider]  Ascorbic Acid (VITAMIN C PO) Take by mouth.    [provider]  Calcium-Magnesium-Zinc (CAL-MAG-ZINC PO) Take by mouth.    [provider]  Cholecalciferol (VITAMIN D3 PO) Take by mouth.    [provider]  Coenzyme Q10 (CO Q 10 PO) Take by mouth.    [provider]  HYDROcodone-acetaminophen (NORCO/VICODIN) 5-325 MG tablet Take 1 tablet by mouth every 4 (four) hours as needed for severe pain. 10/15/19   Enrique Sack, FNP  ibuprofen (ADVIL) 800 MG tablet Take 1 tablet (800 mg total) by mouth 3 (three) times daily. Take 1 tablet by mouth three times a day with food x 5 days then may take as needed 10/15/19   Enrique Sack, FNP  meloxicam (MOBIC) 15 MG tablet Take 1 tablet (15 mg total) by mouth daily for 7  days. 10/15/19 10/22/19  Enrique Sack, FNP  Omega-3 Fatty Acids (FISH OIL PO) Take by mouth.    [provider]  pantoprazole (PROTONIX) 40 MG tablet Take 1 tablet (40 mg total) by mouth daily. 04/21/19   Thornton Park, MD  predniSONE (DELTASONE) 20 MG tablet Take 2 tablets (40 mg total) by mouth daily for 5 days. 10/15/19 10/20/19  Enrique Sack, FNP  progesterone (Jolly) 100 MG capsule  10/13/19   [provider]  Saw Palmetto, Serenoa repens, (SAW PALMETTO PO) Take by mouth.    [provider]  SYNTHROID 50 MCG tablet TAKE 1 TABLET(50 MCG) BY MOUTH DAILY 07/13/19    Briscoe Deutscher, DO  tiZANidine (ZANAFLEX) 4 MG capsule TK 1 C PO TID PRN 10/09/19   [provider]  TURMERIC PO Take by mouth.    [provider]  Zinc Sulfate (ZINC 15 PO) Take by mouth.    [provider]    Family History Family History  Problem Relation Age of Onset  . Breast cancer Mother   . Aneurysm Mother   . Heart Problems Father   . Headache Sister   . Other Sister        vertigo  . Colon cancer Neg Hx   . Esophageal cancer Neg Hx   . Stomach cancer Neg Hx   . Rectal cancer Neg Hx     Social History Social History   Tobacco Use  . Smoking status: Never Smoker  . Smokeless tobacco: Never Used  Substance Use Topics  . Alcohol use: No  . Drug use: No     Allergies   Fructose and Gluten meal   Review of Systems Review of Systems  Genitourinary: Negative for dysuria.  Musculoskeletal: Positive for back pain.  All other systems reviewed and are negative.    Physical Exam Triage Vital Signs ED Triage Vitals  Enc Vitals Group     BP 10/15/19 1052 136/84     Pulse Rate 10/15/19 1034 85     Resp 10/15/19 1034 17     Temp 10/15/19 1034 98.4 F (36.9 C)     Temp Source 10/15/19 1034 Oral     SpO2 10/15/19 1034 96 %     Weight --      Height --      Head Circumference --      Peak Flow --      Pain Score 10/15/19 1030 10     Pain Loc --      Pain Edu? --      Excl. in [place]? --    No data found.  Updated Vital Signs BP 136/84 (BP Location: Left Arm)   Pulse 85   Temp 98.4 F (36.9 C) (Oral)   Resp 17   SpO2 96%   Visual Acuity Right Eye Distance:   Left Eye Distance:   Bilateral Distance:    Right Eye Near:   Left Eye Near:    Bilateral Near:     Physical Exam Vitals signs reviewed.  Constitutional:      Appearance: Normal appearance. She is not ill-appearing or toxic-appearing.  HENT:     Head: Normocephalic.  Neck:     Musculoskeletal: Normal range of motion and neck supple.  Cardiovascular:     Rate  and Rhythm: Normal rate and regular rhythm.  Pulmonary:     Effort: Pulmonary effort is normal.     Breath sounds: Normal breath sounds.  Musculoskeletal: Normal range of motion.  Thoracic back: She exhibits tenderness and pain. She exhibits normal range of motion, no bony tenderness, no swelling, no edema, no deformity and no spasm.     Lumbar back: She exhibits tenderness, pain and spasm. She exhibits normal range of motion, no bony tenderness, no swelling, no edema, no deformity and no laceration.  Lymphadenopathy:     Cervical: No cervical adenopathy.  Skin:    General: Skin is warm and dry.  Neurological:     General: No focal deficit present.     Mental Status: She is alert and oriented to person, place, and time.     Cranial Nerves: Cranial nerves are intact.     Sensory: Sensation is intact.     Motor: Motor function is intact.     Coordination: Coordination is intact.     Gait: Gait is intact.      UC Treatments / Results  Labs (all labs ordered are listed, but only abnormal results are displayed) Labs Reviewed - No data to display  EKG   Radiology No results found.  Procedures Procedures (including critical care time)  Medications Ordered in UC Medications  ketorolac (TORADOL) injection 60 mg (has no administration in time range)  methylPREDNISolone sodium succinate (SOLU-MEDROL) 125 mg/2 mL injection 80 mg (has no administration in time range)    Initial Impression / Assessment and Plan / UC Course  I have reviewed the triage vital signs and the nursing notes.  Pertinent labs & imaging results that were available during my care of the patient were reviewed by me and considered in my medical decision making (see chart for details).    53 yo female presenting with acute thoracic and lumbar muscle strain x 1 week. She was evaluated by her PCP and chiropractor for the same with worsening pain. Patient has thoracic and lumbar muscular tenderness noted on  exam.  No focal deficits noted. Natural history and expected course discussed. Neurosurgeon distributed. Proper lifting, bending technique discussed. Stretching exercises discussed.Short (2-4 day) period of relative rest recommended until acute symptoms improve. Ice to affected area as needed for local pain relief. Heat to affected area as needed for local pain relief. NSAIDs per medication orders. Muscle relaxants per medication orders.  Steroids per medication orders.  Follow up with orthopedics if no improvement in symptoms.  Today's evaluation has revealed no signs of a dangerous process. Discussed diagnosis with patient and/or guardian. Patient and/or guardian aware of their diagnosis, possible red flag symptoms to watch out for and need for close follow up. Patient and/or guardian understands verbal and written discharge instructions. Patient and/or guardian comfortable with plan and disposition.  Patient and/or guardian has a clear mental status at this time, good insight into illness (after discussion and teaching) and has clear judgment to make decisions regarding their care  This care was provided during an unprecedented National Emergency due to the Novel Coronavirus (COVID-19) pandemic. COVID-19 infections and transmission risks place heavy strains on healthcare resources.  As this pandemic evolves, our facility, providers, and staff strive to respond fluidly, to remain operational, and to provide care relative to available resources and information. Outcomes are unpredictable and treatments are without well-defined guidelines. Further, the impact of COVID-19 on all aspects of urgent care, including the impact to patients seeking care for reasons other than COVID-19, is unavoidable during this national emergency. At this time of the global pandemic, management of patients has significantly changed, even for non-COVID positive patients given high local and regional COVID  volumes at this time  requiring high healthcare system and resource utilization. The standard of care for management of both COVID suspected and non-COVID suspected patients continues to change rapidly at the local, regional, national, and global levels. This patient was worked up and treated to the best available but ever changing evidence and resources available at this current time.   Documentation was completed with the aid of voice recognition software. Transcription may contain typographical errors. Final Clinical Impressions(s) / UC Diagnoses   Final diagnoses:  Thoracic myofascial strain, initial encounter  Strain of lumbar paraspinous muscle, initial encounter     Discharge Instructions     1. Continue to take the muscle relaxer as prescribed by your doctor  2. Take the medications that I have for prescribed for you as well  3. Rest your injured muscles for at least 5 days. NO lifting more than 10 pounds. Limit pulling, climbing, bending or extensive over head reaching activities.  4. Alternate between ice and heat to affected areas at least three times a day for 15-20 minutes each time.  5. You should start feeling better in the next couple of days. Recovery from muscle injuries generally takes 1-2 weeks. Complete healing normally takes 5-6 weeks. 6. Follow-up with ortho if no improvement in symptoms   Take Care!  Aldona Bar, FNP-C     ED Prescriptions    Medication Sig Dispense Auth. Provider   predniSONE (DELTASONE) 20 MG tablet Take 2 tablets (40 mg total) by mouth daily for 5 days. 10 tablet Enrique Sack, FNP   meloxicam (MOBIC) 15 MG tablet Take 1 tablet (15 mg total) by mouth daily for 7 days. 7 tablet Enrique Sack, FNP   ibuprofen (ADVIL) 800 MG tablet Take 1 tablet (800 mg total) by mouth 3 (three) times daily. Take 1 tablet by mouth three times a day with food x 5 days then may take as needed 30 tablet Enrique Sack, FNP   HYDROcodone-acetaminophen (NORCO/VICODIN) 5-325 MG  tablet Take 1 tablet by mouth every 4 (four) hours as needed for severe pain. 10 tablet Enrique Sack, FNP     I have reviewed the PDMP during this encounter.   Enrique Sack, [place] 10/15/19 1119

## 2019-10-30 ENCOUNTER — Other Ambulatory Visit: Payer: Self-pay

## 2019-10-30 ENCOUNTER — Ambulatory Visit: Payer: Self-pay

## 2019-10-30 ENCOUNTER — Ambulatory Visit (INDEPENDENT_AMBULATORY_CARE_PROVIDER_SITE_OTHER): Payer: BC Managed Care – PPO | Admitting: Family Medicine

## 2019-10-30 ENCOUNTER — Encounter: Payer: Self-pay | Admitting: Family Medicine

## 2019-10-30 DIAGNOSIS — M546 Pain in thoracic spine: Secondary | ICD-10-CM

## 2019-10-30 MED ORDER — MELOXICAM 15 MG PO TABS: 7.5000 mg | ORAL_TABLET | Freq: Every day | ORAL | 6 refills | Status: DC | PRN

## 2019-10-30 NOTE — Progress Notes (Signed)
Office Visit Note   Patient: Caitlyn Page           Date of Birth: 29-Mar-1966           MRN: [id number] Visit Date: 10/30/2019 Requested by: No referring provider defined for this encounter. PCP: System, Provider Not In  Subjective: Chief Complaint  Patient presents with  . Middle Back - Pain    Fell in February of this year. Then 3 weeks ago, was jumping up & down and felt "a bone snap" in the middle of her back. Was having spasms afterward. Meloxicam 15 mg helps. Went to [facility] - suggested she come here.    HPI: She is here at the request of Kym Groom for thoracic pain.  About 3 weeks ago she was doing a video exercise program which involves a lot of jumping up and down.  She did this for 3 days in a row and then started having severe spasms in her lower thoracic area.  She went to her PCP 3 different times, she went to a chiropractor 7 times, she had x-rays per the chiropractor which were interpreted as unremarkable.  She then went to an urgent care and was given intramuscular injections as well as oral prednisone, ibuprofen, tizanidine, hydrocodone and meloxicam.  Meloxicam seems to help her more than anything else.  She was becoming frustrated by her ongoing pain and went to see Edwena Felty this past Friday and as soon as she touched the area in her lower thoracic spine, she had a severe pain reaction so an appointment was made to come here for evaluation.  She states that when she lies flat on her back, sometimes she feels "paralyzed" in the legs, she cannot move them.  Denies any bowel or bladder dysfunction currently but she did have troubles with bladder control early on.  She is never had problems with her thoracic spine before.  She is never had a bone density test or a compression fracture or other stress type fracture.  She does report chronic toe pain in the right foot but she plans to make another appointment to discuss that.  She does not smoke cigarettes and she does take  vitamin D3, vitamin K2 and magnesium already.  She has struggled with projectile vomiting for the past year intermittently.  She has had upper GI studies and most recently was referred for a CT scan of her chest and abdomen which she brought for review on CD today.               ROS: No fevers or chills.  All other systems were reviewed and are negative.  Objective: Vital Signs: There were no vitals taken for this visit.  Physical Exam:  General:  Alert and oriented, in no acute distress. Pulm:  Breathing unlabored. Psy:  Normal mood, congruent affect. Skin: No rash. Thoracic spine: She is extremely tender to palpation in the midline over the T11 spinous process.  No other significant areas of pain.  Negative bilateral straight leg raise, lower extremity strength and reflexes are normal.  Imaging: None today but independent review by me of her CT scan on CD reveals a T11 compression fracture, mostly anterior, with roughly 30% collapse.  Ribs look intact.  Assessment & Plan: 1.  Subacute lower thoracic back pain due to T11 compression fracture -We will order an MRI scan to rule out retropulsion.  If the spinal canal and neural foramen look good, then we will treat conservatively with activity  modification.  She is already wearing a brace for support.    Procedures: No procedures performed  No notes on file     PMFS History: Patient Active Problem List   Diagnosis Date Noted  . Vertigo 12/23/2018  . Vestibular migraine 12/23/2018  . Insulin resistance 10/12/2017  . Anxiety 10/12/2017  . History of peptic ulcer disease 09/08/2017  . Hypothyroidism 08/16/2017   Past Medical History:  Diagnosis Date  . Allergy   . Anxiety   . Arthritis   . Chronic kidney disease   . Depression   . Frequent headaches   . GERD (gastroesophageal reflux disease)   . Prediabetes   . Right foot pain   . Sinus problem   . Thyroid disease   . Vision problems     Family History  Problem  Relation Age of Onset  . Breast cancer Mother   . Aneurysm Mother   . Heart Problems Father   . Headache Sister   . Other Sister        vertigo  . Colon cancer Neg Hx   . Esophageal cancer Neg Hx   . Stomach cancer Neg Hx   . Rectal cancer Neg Hx     Past Surgical History:  Procedure Laterality Date  . sinus surgery   2000  . TONSILLECTOMY AND ADENOIDECTOMY     Sometime in the 1980's   Social History   Occupational History  . Not on file  Tobacco Use  . Smoking status: Never Smoker  . Smokeless tobacco: Never Used  Substance and Sexual Activity  . Alcohol use: No  . Drug use: No  . Sexual activity: Not on file

## 2019-11-20 ENCOUNTER — Other Ambulatory Visit: Payer: Self-pay

## 2019-11-20 ENCOUNTER — Ambulatory Visit
Admission: RE | Admit: 2019-11-20 | Discharge: 2019-11-20 | Disposition: A | Discharge status: Home / Self Care | Payer: BC Managed Care – PPO | Source: Ambulatory Visit | Attending: Family Medicine | Admitting: Family Medicine

## 2019-11-20 ENCOUNTER — Telehealth: Payer: Self-pay | Admitting: Family Medicine

## 2019-11-20 DIAGNOSIS — M546 Pain in thoracic spine: Secondary | ICD-10-CM

## 2019-11-20 NOTE — Telephone Encounter (Signed)
MRI confirms a compression fracture.  It involves the T12 vertebra and is roughly 30% collapsed in front.  Bone protrudes slightly into the spinal canal, but not enough to compress nerves or spinal cord.  No surgery needed.

## 2019-12-12 ENCOUNTER — Telehealth: Payer: Self-pay | Admitting: Hematology and Oncology

## 2019-12-12 NOTE — Telephone Encounter (Signed)
Received a new hem referral from [facility] for anemia. Caitlyn Page has been cld and scheduled to see Dr. Lorenso Courier on 2/1 at [facility]. Pt aware to arrive 15 minutes early.

## 2019-12-17 NOTE — Progress Notes (Signed)
[place] [phone]   Fax:([phone]) [place] NOTE  Patient Care Team: System, Provider Not In as PCP - General  Hematological/Oncological History # Normocytic Anemia 1)  12/16/2018: WBC 4.8, Hgb 12.6, Plt 334, MCV 92.1.  2)  10/24/2019: WBC 6.3, Hgb 9.2, MCV 84, Plt 481, RDW 17.2%, Cr. 1.2 3) 12/18/2019: Establish care with Dr. Lorenso Courier   CHIEF COMPLAINTS/PURPOSE OF CONSULTATION:  "Anemia "  HISTORY OF PRESENTING ILLNESS:  Caitlyn Page 54 y.o. female with medical history significant for GERD, CKD, hypothyroidism who presents for evaluation of an acute onset normocytic anemia.   On review of the previous records Ms. Garraway last had a hemoglobin in our system on 12/16/2018.  At that time showed a white blood cell count of 14.8 hemoglobin of 12.6 platelets of 334 and MCV of 92.1.  Most recently she had a CBC performed by her PCP on 10/24/2019 which showed a white blood cell count 6.3 hemoglobin 9.2 MCV of 84 platelets of 481 and a mildly elevated creatinine of 1.2.  Due to concern for this new onset normocytic anemia the patient was referred to hematology for further evaluation and management.  Of note colonoscopy and EGD were performed on 07/07/2019 which revealed a 37m polyp, but no clear sources of bleeding were identified.   On exam today [name] recently learned that she was anemic.  She reports that she has been having issues with lightheadedness, fatigue, and shortness of breath.  She notes that she has not seen any overt signs or symptoms of bleeding.  She reports no nosebleeds, gum bleeding, bruising, or dark stools.  She reports that she has had numerous health issues this year including fall with a cracked rib January of last year as well as falls in November 2020 of which resulted in a T11 compression fracture.  She reports that she has gone through menopause in her early 89sand has not had any GYN bleeding as well.    She reports that  she does take zinc for Covid prevention and that she is on a diet that does not include gluten, eggs, milk.  She reports that she did have stomach ulcers before in the past which he treated on raw diet and juicing.  She also notes that she occasionally does what is noted to water fast where she only drinks water for several days straight.  In terms of family history it is remarkable for a maternal grandfather who had aplastic anemia.  The only medication she takes are a natural thyroid as well as numerous other nutritional supplementations.  A full 10 point ROS is listed below.  MEDICAL HISTORY:  Past Medical History:  Diagnosis Date  . Allergy   . Anxiety   . Arthritis   . Chronic kidney disease   . Depression   . Frequent headaches   . GERD (gastroesophageal reflux disease)   . Prediabetes   . Right foot pain   . Sinus problem   . Thyroid disease   . Vision problems     SURGICAL HISTORY: Past Surgical History:  Procedure Laterality Date  . sinus surgery   2000  . TONSILLECTOMY AND ADENOIDECTOMY     Sometime in the 1980's    SOCIAL HISTORY: Social History   Socioeconomic History  . Marital status: Single    Spouse name: Not on file  . Number of children: 0  . Years of education: Not on file  . Highest education level: Not  on file  Occupational History  . Not on file  Tobacco Use  . Smoking status: Never Smoker  . Smokeless tobacco: Never Used  Substance and Sexual Activity  . Alcohol use: No  . Drug use: No  . Sexual activity: Not on file  Other Topics Concern  . Not on file  Social History Narrative   Lives at home alone    Right handed   Caffeine: a few times per week   Social Determinants of Health   Financial Resource Strain:   . Difficulty of Paying Living Expenses: Not on file  Food Insecurity:   . Worried About Charity fundraiser in the Last Year: Not on file  . Ran Out of Food in the Last Year: Not on file  Transportation Needs:   . Lack of  Transportation (Medical): Not on file  . Lack of Transportation (Non-Medical): Not on file  Physical Activity:   . Days of Exercise per Week: Not on file  . Minutes of Exercise per Session: Not on file  Stress:   . Feeling of Stress : Not on file  Social Connections:   . Frequency of Communication with Friends and Family: Not on file  . Frequency of Social Gatherings with Friends and Family: Not on file  . Attends Religious Services: Not on file  . Active Member of Clubs or Organizations: Not on file  . Attends Archivist Meetings: Not on file  . Marital Status: Not on file  Intimate Partner Violence:   . Fear of Current or Ex-Partner: Not on file  . Emotionally Abused: Not on file  . Physically Abused: Not on file  . Sexually Abused: Not on file    FAMILY HISTORY: Family History  Problem Relation Age of Onset  . Breast cancer Mother   . Aneurysm Mother   . Heart Problems Father   . Headache Sister   . Other Sister        vertigo  . Colon cancer Neg Hx   . Esophageal cancer Neg Hx   . Stomach cancer Neg Hx   . Rectal cancer Neg Hx     ALLERGIES:  is allergic to egg [eggs or egg-derived products]; fructose; and gluten meal.  MEDICATIONS:  Current Outpatient Medications  Medication Sig Dispense Refill  . levothyroxine (SYNTHROID) 50 MCG tablet Take 30 mcg by mouth daily before breakfast.    . Ascorbic Acid (VITAMIN C PO) Take by mouth.    . Calcium-Magnesium-Zinc (CAL-MAG-ZINC PO) Take by mouth.    . Cholecalciferol (VITAMIN D3 PO) Take by mouth.    . Coenzyme Q10 (CO Q 10 PO) Take by mouth.    . Omega-3 Fatty Acids (FISH OIL PO) Take by mouth.    . progesterone (PROMETRIUM) 100 MG capsule     . thyroid (NP THYROID) 30 MG tablet Take 30 mg by mouth daily before breakfast.    . tiZANidine (ZANAFLEX) 4 MG capsule TK 1 C PO TID PRN    . TURMERIC PO Take by mouth.    . Zinc Sulfate (ZINC 15 PO) Take by mouth.     Current Facility-Administered Medications    Medication Dose Route Frequency Provider Last Rate Last Admin  . 0.9 %  sodium chloride infusion  500 mL Intravenous Once [place], MD        REVIEW OF SYSTEMS:   Constitutional: ( - ) fevers, ( - )  chills , ( - ) night sweats Eyes: ( - )  blurriness of vision, ( - ) double vision, ( - ) watery eyes Ears, nose, mouth, throat, and face: ( - ) mucositis, ( - ) sore throat Respiratory: ( - ) cough, ( +) dyspnea, ( - ) wheezes Cardiovascular: ( - ) palpitation, ( - ) chest discomfort, ( - ) lower extremity swelling Gastrointestinal:  ( + ) nausea, ( - ) heartburn, ( - ) change in bowel habits Skin: ( - ) abnormal skin rashes Lymphatics: ( - ) new lymphadenopathy, ( - ) easy bruising Neurological: ( - ) numbness, ( - ) tingling, ( - ) new weaknesses Behavioral/Psych: ( - ) mood change, ( - ) new changes  All other systems were reviewed with the patient and are negative.  PHYSICAL EXAMINATION: ECOG PERFORMANCE STATUS: 1 - Symptomatic but completely ambulatory  Vitals:   12/18/19 0925  BP: 117/85  Pulse: 84  Resp: 17  Temp: 98 F (36.7 C)  SpO2: 99%   Filed Weights   12/18/19 0925  Weight: 198 lb (89.8 kg)    GENERAL: well appearing middle aged Caucasian female in NAD  SKIN: skin color, texture, turgor are normal, no rashes or significant lesions EYES: conjunctiva are pale and non-injected, sclera clear LUNGS: clear to auscultation and percussion with normal breathing effort HEART: regular rate & rhythm and no murmurs and no lower extremity edema Musculoskeletal: no cyanosis of digits and no clubbing  PSYCH: alert & oriented x 3, fluent speech NEURO: no focal motor/sensory deficits  LABORATORY DATA:  I have reviewed the data as listed CBC Latest Ref Rng & Units 12/16/2018 08/16/2017  WBC 4.0 - 10.5 K/uL 4.8 4.6  Hemoglobin 12.0 - 15.0 g/dL 12.6 13.4  Hematocrit 36.0 - 46.0 % 38.9 41.0  Platelets 150.0 - 400.0 K/uL 334.0 289.0    CMP Latest Ref Rng & Units  12/23/2018 12/16/2018 10/12/2017  Glucose 70 - 99 mg/dL 76 95 99  BUN 6 - 23 mg/dL '14 11 13  '$ Creatinine 0.40 - 1.20 mg/dL 1.04 1.04 1.00  Sodium 135 - 145 mEq/L 140 142 140  Potassium 3.5 - 5.1 mEq/L 4.5 5.3(H) 4.6  Chloride 96 - 112 mEq/L 104 105 104  CO2 19 - 32 mEq/L '27 30 28  '$ Calcium 8.4 - 10.5 mg/dL 9.4 9.7 9.3  Total Protein 6.0 - 8.3 g/dL - 6.7 6.6  Total Bilirubin 0.2 - 1.2 mg/dL - 0.4 0.5  Alkaline Phos 39 - 117 U/L - 74 80  AST 0 - 37 U/L - 19 21  ALT 0 - 35 U/L - 15 17     PATHOLOGY: None relevant to review.   BLOOD FILM: Review of the peripheral blood smear showed normal appearing white cells with neutrophils that were appropriately lobated and granulated. There was no predominance of bi-lobed or hyper-segmented neutrophils appreciated. No Dohle bodies were noted. There was no left shifting, immature forms or blasts noted. Lymphocytes remain normal in size without any predominance of large granular lymphocytes. Red cells show no anisopoikilocytosis, macrocytes. Increased hypochromia and microcytes. There were no schistocytes, target cells, echinocytes, acanthocytes, dacrocytes, or stomatocytes.There was no rouleaux formation, nucleated red cells, or intra-cellular inclusions noted. The platelets are normal in size, shape, and color without any clumping evident.  RADIOGRAPHIC STUDIES: MR Thoracic Spine w/o contrast  Result Date: 11/20/2019 CLINICAL DATA:  Spine fracture, thoracic, traumatic. Additional history provided: Injury from fall in February 2020, continued back pain radiating into ribs and sternum, known thoracic compression fracture. EXAM: MRI THORACIC SPINE WITHOUT CONTRAST  TECHNIQUE: Multiplanar, multisequence MR imaging of the thoracic spine was performed. No intravenous contrast was administered. COMPARISON:  Rib series 01/14/2019 FINDINGS: Cervical spondylosis is incompletely assessed on scout localizer imaging. Multilevel disc degeneration within the cervical spine  greatest at C5-C6 and C6-C7 where there are posterior disc osteophyte complexes. Alignment: Focal kyphotic deformity at T11-T12. Trace bony retropulsion at the level of the T12 superior endplate. Alignment otherwise maintained. Vertebrae: Vertebral body height is maintained. T12 anterior wedge compression fracture with approximately 30% height loss. Trace bony retropulsion at the level of the T12 superior endplate. There is prominent edema within the T12 vertebral body. Mild ventral bowing of the anterior longitudinal ligament. There is also a minimally displaced fracture of the T11 spinous process with prominent edema at this site. Vertebral body height is otherwise maintained. No other marrow edema is identified. Cord:  No spinal cord signal abnormality. Paraspinal and other soft tissues: No abnormality identified within included portions of the thorax or upper abdomen/retroperitoneum. STIR hyperintense signal within the ligamentum flavum bilaterally at T11-T12 suggestive of ligamentous injury. (Series 19, image 9) (series 19, image 14). Disc levels: No more than mild disc degeneration at any level. No focal disc herniation. Trace bony retropulsion at the level of the T12 superior endplate minimally effaces the ventral thecal sac. However, there is no significant central canal stenosis. No contact upon the spinal cord. No significant neural foraminal narrowing. These results will be called to the ordering clinician or representative by the Radiologist Assistant, and communication documented in the PACS or zVision Dashboard. IMPRESSION: T12 anterior wedge compression fracture with ~30% height loss. Minimally displaced fracture of the T11 spinous process. Prominent edema at these fracture sites suggests that these fractures are acute or subacute versus interval re-injury since reported fractures in February 2020. Additionally, some of the edema may be attributed to instability at this level. Signal abnormality  within the ligamentum flavum at T11-T12 suggestive of ligamentous injury. Focal T11-T12 kyphotic angulation. Trace bony retropulsion at the level of the T12 superior endplate minimally effacing the ventral thecal sac, but without significant central canal stenosis or contact upon the spinal cord. Vertebral body height otherwise maintained. Minimal thoracic spondylosis. No significant degenerative spinal canal stenosis or neural foraminal narrowing. Electronically Signed   By: Kellie Simmering DO   On: 11/20/2019 08:35    ASSESSMENT & PLAN Alitza Cowman 54 y.o. female with medical history significant for GERD, CKD, hypothyroidism who presents for evaluation of an acute onset normocytic anemia.  After review of the labs and discussion with the patient there is currently no clear etiology for this patient's normocytic anemia.  She recently had colonoscopy and upper GI endoscopy performed on 07/07/2019 which showed no clear sources of bleeding.  Biopsies of the stomach tested negative for H. pylori.  Of note these were performed for screening purposes and dysphagia respectively.  The anemia appears to have appeared rapidly given that she had normal blood counts approximately 1 year before.  The elevation in platelets is highly suspicious for an iron deficiency anemia, however that would leave the question as to what was the source of the bleeding.  Today we will do a broad work-up to try to determine the etiology of this patient's anemia.  Her studies will include nutritional studies, lysis labs, inflammatory studies, and multiple myeloma rule out.  If no clear etiology can be discerned from the above labs we will need to consider a bone marrow biopsy.  #Normocytic Anemia, acute --today will repeat CBC,  CMP and also assess reticulocyte panel and peripheral blood film --will collect nutritional studies to include iron panel, ferritin, Vitamin B12, folate, MMA, homocysteine and copper. --if iron deficiency is found  to be the cause would recommend evaluation with capsule endoscopy.  --recommend stopping Zinc supplementation. --additionally will collect SPEP, SFLC, and inflammatory markers CRP/ESR --lysis labs to include LDH and haptoglobin --no clear indication for a bone marrow biopsy at this time, though if no clear etiology can be determined from the labs above it would need to be considered. --RTC in 3 months or sooner pending the above labs.   Orders Placed This Encounter  Procedures  . CBC with Differential (Cancer Center Only)    Standing Status:   Future    Standing Expiration Date:   12/17/2020  . Retic Panel    Standing Status:   Future    Standing Expiration Date:   12/17/2020  . Save Smear (SSMR)    Standing Status:   Future    Standing Expiration Date:   12/17/2020  . CMP ([facility] only)    Standing Status:   Future    Standing Expiration Date:   12/17/2020  . Lactate dehydrogenase (LDH)    Standing Status:   Future    Standing Expiration Date:   12/17/2020  . Iron and TIBC    Standing Status:   Future    Standing Expiration Date:   12/17/2020  . Ferritin    Standing Status:   Future    Standing Expiration Date:   12/17/2020  . TSH    Standing Status:   Future    Standing Expiration Date:   12/17/2020  . Vitamin B12    Standing Status:   Future    Standing Expiration Date:   12/17/2020  . Folate, Serum    Standing Status:   Future    Standing Expiration Date:   12/17/2020  . Methylmalonic acid, serum    Standing Status:   Future    Standing Expiration Date:   12/17/2020  . SPEP (Serum protein electrophoresis)    Standing Status:   Future    Standing Expiration Date:   12/17/2020  . Kappa/lambda light chains    Standing Status:   Future    Standing Expiration Date:   12/17/2020  . Homocysteine, serum    Standing Status:   Future    Standing Expiration Date:   12/17/2020  . Erythropoietin    Standing Status:   Future    Standing Expiration Date:   12/17/2020  . Copper, serum    Standing  Status:   Future    Standing Expiration Date:   12/17/2020  . Sedimentation rate    Standing Status:   Future    Standing Expiration Date:   12/17/2020  . C-reactive protein    Standing Status:   Future    Standing Expiration Date:   12/17/2020  . Haptoglobin    Standing Status:   Future    Standing Expiration Date:   12/17/2020    All questions were answered. The patient knows to call the clinic with any problems, questions or concerns.  A total of more than 60 minutes were spent on this encounter and over half of that time was spent on counseling and coordination of care as outlined above.   Ledell Peoples, MD Department of Hematology/Oncology [place] [facility] Phone: [phone] Pager: [phone] Email: [email or web address]  12/18/2019 9:44 AM

## 2019-12-18 ENCOUNTER — Inpatient Hospital Stay: Payer: BC Managed Care – PPO

## 2019-12-18 ENCOUNTER — Other Ambulatory Visit: Payer: Self-pay

## 2019-12-18 ENCOUNTER — Encounter: Payer: Self-pay | Admitting: Hematology and Oncology

## 2019-12-18 ENCOUNTER — Inpatient Hospital Stay: Payer: BC Managed Care – PPO | Attending: Hematology and Oncology | Admitting: Hematology and Oncology

## 2019-12-18 VITALS — BP 117/85 | HR 84 | Temp 98.0°F | Resp 17 | Ht 64.0 in | Wt 198.0 lb

## 2019-12-18 DIAGNOSIS — M129 Arthropathy, unspecified: Secondary | ICD-10-CM | POA: Diagnosis not present

## 2019-12-18 DIAGNOSIS — F418 Other specified anxiety disorders: Secondary | ICD-10-CM | POA: Insufficient documentation

## 2019-12-18 DIAGNOSIS — M2578 Osteophyte, vertebrae: Secondary | ICD-10-CM | POA: Diagnosis not present

## 2019-12-18 DIAGNOSIS — K219 Gastro-esophageal reflux disease without esophagitis: Secondary | ICD-10-CM | POA: Diagnosis not present

## 2019-12-18 DIAGNOSIS — R944 Abnormal results of kidney function studies: Secondary | ICD-10-CM | POA: Diagnosis not present

## 2019-12-18 DIAGNOSIS — N189 Chronic kidney disease, unspecified: Secondary | ICD-10-CM | POA: Insufficient documentation

## 2019-12-18 DIAGNOSIS — E079 Disorder of thyroid, unspecified: Secondary | ICD-10-CM | POA: Diagnosis not present

## 2019-12-18 DIAGNOSIS — M50322 Other cervical disc degeneration at C5-C6 level: Secondary | ICD-10-CM | POA: Diagnosis not present

## 2019-12-18 DIAGNOSIS — D649 Anemia, unspecified: Secondary | ICD-10-CM | POA: Insufficient documentation

## 2019-12-18 DIAGNOSIS — Z79899 Other long term (current) drug therapy: Secondary | ICD-10-CM | POA: Diagnosis not present

## 2019-12-18 LAB — RETIC PANEL
Immature Retic Fract: 18.4 % — ABNORMAL HIGH (ref 2.3–15.9)
RBC.: 3.86 MIL/uL — ABNORMAL LOW (ref 3.87–5.11)
Retic Count, Absolute: 37.4 10*3/uL (ref 19.0–186.0)
Retic Ct Pct: 1 % (ref 0.4–3.1)
Reticulocyte Hemoglobin: 21.4 pg — ABNORMAL LOW (ref >27.9)

## 2019-12-18 LAB — CMP (CANCER CENTER ONLY)
ALT: 18 U/L (ref 0–44)
AST: 22 U/L (ref 15–41)
Albumin: 4 g/dL (ref 3.5–5.0)
Alkaline Phosphatase: 90 U/L (ref 38–126)
Anion gap: 7 (ref 5–15)
BUN: 12 mg/dL (ref 6–20)
CO2: 26 mmol/L (ref 22–32)
Calcium: 9 mg/dL (ref 8.9–10.3)
Chloride: 108 mmol/L (ref 98–111)
Creatinine: 1.1 mg/dL — ABNORMAL HIGH (ref 0.44–1.00)
GFR, Est AFR Am: >60 mL/min (ref >60)
GFR, Estimated: 58 mL/min — ABNORMAL LOW (ref >60)
Glucose, Bld: 89 mg/dL (ref 70–99)
Potassium: 4.2 mmol/L (ref 3.5–5.1)
Sodium: 141 mmol/L (ref 135–145)
Total Bilirubin: 0.3 mg/dL (ref 0.3–1.2)
Total Protein: 7.1 g/dL (ref 6.5–8.1)

## 2019-12-18 LAB — CBC WITH DIFFERENTIAL (CANCER CENTER ONLY)
Abs Immature Granulocytes: 0.01 10*3/uL (ref 0.00–0.07)
Basophils Absolute: 0 10*3/uL (ref 0.0–0.1)
Basophils Relative: 1 %
Eosinophils Absolute: 0.2 10*3/uL (ref 0.0–0.5)
Eosinophils Relative: 4 %
HCT: 30.1 % — ABNORMAL LOW (ref 36.0–46.0)
Hemoglobin: 8.9 g/dL — ABNORMAL LOW (ref 12.0–15.0)
Immature Granulocytes: 0 %
Lymphocytes Relative: 40 %
Lymphs Abs: 1.8 10*3/uL (ref 0.7–4.0)
MCH: 23.2 pg — ABNORMAL LOW (ref 26.0–34.0)
MCHC: 29.6 g/dL — ABNORMAL LOW (ref 30.0–36.0)
MCV: 78.4 fL — ABNORMAL LOW (ref 80.0–100.0)
Monocytes Absolute: 0.3 10*3/uL (ref 0.1–1.0)
Monocytes Relative: 7 %
Neutro Abs: 2.1 10*3/uL (ref 1.7–7.7)
Neutrophils Relative %: 48 %
Platelet Count: 366 10*3/uL (ref 150–400)
RBC: 3.84 MIL/uL — ABNORMAL LOW (ref 3.87–5.11)
RDW: 16.9 % — ABNORMAL HIGH (ref 11.5–15.5)
WBC Count: 4.4 10*3/uL (ref 4.0–10.5)
nRBC: 0 % (ref 0.0–0.2)

## 2019-12-18 LAB — SEDIMENTATION RATE: Sed Rate: 30 mm/hr — ABNORMAL HIGH (ref 0–22)

## 2019-12-18 LAB — FOLATE: Folate: 11.7 ng/mL (ref >5.9)

## 2019-12-18 LAB — IRON AND TIBC
Iron: 12 ug/dL — ABNORMAL LOW (ref 41–142)
Saturation Ratios: 3 % — ABNORMAL LOW (ref 21–57)
TIBC: 431 ug/dL (ref 236–444)
UIBC: 419 ug/dL — ABNORMAL HIGH (ref 120–384)

## 2019-12-18 LAB — VITAMIN B12: Vitamin B-12: 1116 pg/mL — ABNORMAL HIGH (ref 180–914)

## 2019-12-18 LAB — SAVE SMEAR(SSMR), FOR PROVIDER SLIDE REVIEW

## 2019-12-18 LAB — TSH: TSH: 0.909 u[IU]/mL (ref 0.308–3.960)

## 2019-12-18 LAB — LACTATE DEHYDROGENASE: LDH: 176 U/L (ref 98–192)

## 2019-12-18 LAB — C-REACTIVE PROTEIN: CRP: 0.8 mg/dL (ref <1.0)

## 2019-12-18 LAB — FERRITIN: Ferritin: 6 ng/mL — ABNORMAL LOW (ref 11–307)

## 2019-12-19 ENCOUNTER — Telehealth: Payer: Self-pay | Admitting: Hematology and Oncology

## 2019-12-19 ENCOUNTER — Encounter: Payer: Self-pay | Admitting: Hematology and Oncology

## 2019-12-19 DIAGNOSIS — D509 Iron deficiency anemia, unspecified: Secondary | ICD-10-CM

## 2019-12-19 HISTORY — DX: Iron deficiency anemia, unspecified: D50.9

## 2019-12-19 LAB — HAPTOGLOBIN: Haptoglobin: 176 mg/dL (ref 33–346)

## 2019-12-19 LAB — ERYTHROPOIETIN: Erythropoietin: 73.5 m[IU]/mL — ABNORMAL HIGH (ref 2.6–18.5)

## 2019-12-19 LAB — PROTEIN ELECTROPHORESIS, SERUM
A/G Ratio: 1.1 (ref 0.7–1.7)
Albumin ELP: 3.6 g/dL (ref 2.9–4.4)
Alpha-1-Globulin: 0.3 g/dL (ref 0.0–0.4)
Alpha-2-Globulin: 0.8 g/dL (ref 0.4–1.0)
Beta Globulin: 1.1 g/dL (ref 0.7–1.3)
Gamma Globulin: 1.1 g/dL (ref 0.4–1.8)
Globulin, Total: 3.2 g/dL (ref 2.2–3.9)
Total Protein ELP: 6.8 g/dL (ref 6.0–8.5)

## 2019-12-19 LAB — KAPPA/LAMBDA LIGHT CHAINS
Kappa free light chain: 14.5 mg/L (ref 3.3–19.4)
Kappa, lambda light chain ratio: 1.12 (ref 0.26–1.65)
Lambda free light chains: 13 mg/L (ref 5.7–26.3)

## 2019-12-19 LAB — HOMOCYSTEINE: Homocysteine: 8.2 umol/L (ref 0.0–14.5)

## 2019-12-19 NOTE — Telephone Encounter (Signed)
Called Mrs. Vialpando to discuss the results of her bloodwork from yesterday. Findings were most consistent with an iron deficiency anemia. Hgb 8.9, Ferritin 6 and iron sat 3% with elevated TIBC. Patient has tried oral iron in the past and had intolerance with stomach pain/nausea/vomiting.  As such we will attempt to schedule the patient for IV iron. My preference would be for Feraheme 510mg  q 7 days x 2 doses. Per the [facility] equation, the patient's iron deficiency is 1162mg . U  Unfortunately the source of the patient's iron deficiency is not clear. Her diet is not likely, and she has had EGD/colonoscopy. Would recommend they consider capsule endoscopy. Patient voiced her understanding of the findings and plans.   Ledell Peoples, MD Department of Hematology/Oncology [place] at [facility] Phone: [phone] Pager: [phone] Email: [email or web address]

## 2019-12-20 ENCOUNTER — Telehealth: Payer: Self-pay | Admitting: Hematology and Oncology

## 2019-12-20 LAB — COPPER, SERUM: Copper: 185 ug/dL — ABNORMAL HIGH (ref 80–158)

## 2019-12-20 NOTE — Telephone Encounter (Signed)
Scheduled per los. Called and left msg. Mailed printout  °

## 2019-12-23 LAB — METHYLMALONIC ACID, SERUM: Methylmalonic Acid, Quantitative: 164 nmol/L (ref 0–378)

## 2019-12-25 ENCOUNTER — Telehealth: Payer: Self-pay | Admitting: Hematology and Oncology

## 2019-12-25 NOTE — Telephone Encounter (Signed)
Scheduled per los. Called and spoke with patient. Confirmed appts  

## 2020-01-01 ENCOUNTER — Other Ambulatory Visit: Payer: Self-pay

## 2020-01-01 ENCOUNTER — Inpatient Hospital Stay: Payer: BC Managed Care – PPO

## 2020-01-01 VITALS — BP 109/66 | HR 71 | Temp 98.8°F | Resp 18

## 2020-01-01 DIAGNOSIS — D649 Anemia, unspecified: Secondary | ICD-10-CM | POA: Diagnosis not present

## 2020-01-01 DIAGNOSIS — D509 Iron deficiency anemia, unspecified: Secondary | ICD-10-CM

## 2020-01-01 MED ORDER — SODIUM CHLORIDE 0.9 % IV SOLN
510.0000 mg | Freq: Once | INTRAVENOUS | Status: AC
  Administered 2020-01-01: 510 mg via INTRAVENOUS
  Filled 2020-01-01: qty 17

## 2020-01-01 MED ORDER — SODIUM CHLORIDE 0.9 % IV SOLN
Freq: Once | INTRAVENOUS | Status: AC
  Filled 2020-01-01: qty 250

## 2020-01-01 NOTE — Patient Instructions (Signed)

## 2020-01-08 ENCOUNTER — Inpatient Hospital Stay: Payer: BC Managed Care – PPO

## 2020-01-08 ENCOUNTER — Inpatient Hospital Stay: Payer: BC Managed Care – PPO | Admitting: Hematology and Oncology

## 2020-01-08 ENCOUNTER — Other Ambulatory Visit: Payer: Self-pay

## 2020-01-08 VITALS — BP 109/81 | HR 67 | Temp 99.1°F | Resp 17

## 2020-01-08 DIAGNOSIS — D649 Anemia, unspecified: Secondary | ICD-10-CM | POA: Diagnosis not present

## 2020-01-08 DIAGNOSIS — D509 Iron deficiency anemia, unspecified: Secondary | ICD-10-CM

## 2020-01-08 MED ORDER — SODIUM CHLORIDE 0.9 % IV SOLN
Freq: Once | INTRAVENOUS | Status: AC
  Filled 2020-01-08: qty 250

## 2020-01-08 MED ORDER — SODIUM CHLORIDE 0.9 % IV SOLN
510.0000 mg | Freq: Once | INTRAVENOUS | Status: AC
  Administered 2020-01-08: 510 mg via INTRAVENOUS
  Filled 2020-01-08: qty 510

## 2020-01-08 NOTE — Patient Instructions (Signed)

## 2020-03-11 ENCOUNTER — Encounter: Payer: Self-pay | Admitting: Family Medicine

## 2020-03-11 ENCOUNTER — Ambulatory Visit (INDEPENDENT_AMBULATORY_CARE_PROVIDER_SITE_OTHER): Payer: BC Managed Care – PPO | Admitting: Family Medicine

## 2020-03-11 ENCOUNTER — Other Ambulatory Visit: Payer: Self-pay

## 2020-03-11 DIAGNOSIS — M5442 Lumbago with sciatica, left side: Secondary | ICD-10-CM | POA: Diagnosis not present
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
## 2020-03-15 ENCOUNTER — Ambulatory Visit: Payer: BC Managed Care – PPO | Admitting: Hematology and Oncology

## 2020-03-15 ENCOUNTER — Other Ambulatory Visit: Payer: BC Managed Care – PPO

## 2020-03-18 ENCOUNTER — Inpatient Hospital Stay: Payer: BC Managed Care – PPO | Attending: Hematology and Oncology

## 2020-03-18 ENCOUNTER — Other Ambulatory Visit: Payer: Self-pay

## 2020-03-18 ENCOUNTER — Other Ambulatory Visit: Payer: Self-pay | Admitting: Hematology and Oncology

## 2020-03-18 ENCOUNTER — Encounter: Payer: Self-pay | Admitting: Hematology and Oncology

## 2020-03-18 ENCOUNTER — Inpatient Hospital Stay: Payer: BC Managed Care – PPO | Admitting: Hematology and Oncology

## 2020-03-18 VITALS — BP 119/95 | HR 83 | Temp 98.2°F | Resp 20 | Ht 64.0 in | Wt 200.0 lb

## 2020-03-18 DIAGNOSIS — M129 Arthropathy, unspecified: Secondary | ICD-10-CM | POA: Insufficient documentation

## 2020-03-18 DIAGNOSIS — D509 Iron deficiency anemia, unspecified: Secondary | ICD-10-CM | POA: Insufficient documentation

## 2020-03-18 DIAGNOSIS — E079 Disorder of thyroid, unspecified: Secondary | ICD-10-CM | POA: Diagnosis not present

## 2020-03-18 DIAGNOSIS — N189 Chronic kidney disease, unspecified: Secondary | ICD-10-CM | POA: Insufficient documentation

## 2020-03-18 DIAGNOSIS — R5383 Other fatigue: Secondary | ICD-10-CM | POA: Diagnosis not present

## 2020-03-18 DIAGNOSIS — Z79899 Other long term (current) drug therapy: Secondary | ICD-10-CM | POA: Insufficient documentation

## 2020-03-18 DIAGNOSIS — R42 Dizziness and giddiness: Secondary | ICD-10-CM | POA: Insufficient documentation

## 2020-03-18 DIAGNOSIS — K219 Gastro-esophageal reflux disease without esophagitis: Secondary | ICD-10-CM | POA: Diagnosis not present

## 2020-03-18 DIAGNOSIS — F419 Anxiety disorder, unspecified: Secondary | ICD-10-CM | POA: Insufficient documentation

## 2020-03-18 LAB — CMP (CANCER CENTER ONLY)
ALT: 19 U/L (ref 0–44)
AST: 23 U/L (ref 15–41)
Albumin: 3.8 g/dL (ref 3.5–5.0)
Alkaline Phosphatase: 90 U/L (ref 38–126)
Anion gap: 13 (ref 5–15)
BUN: 12 mg/dL (ref 6–20)
CO2: 24 mmol/L (ref 22–32)
Calcium: 9.3 mg/dL (ref 8.9–10.3)
Chloride: 108 mmol/L (ref 98–111)
Creatinine: 1.12 mg/dL — ABNORMAL HIGH (ref 0.44–1.00)
GFR, Est AFR Am: >60 mL/min (ref >60)
GFR, Estimated: 56 mL/min — ABNORMAL LOW (ref >60)
Glucose, Bld: 105 mg/dL — ABNORMAL HIGH (ref 70–99)
Potassium: 4.5 mmol/L (ref 3.5–5.1)
Sodium: 145 mmol/L (ref 135–145)
Total Bilirubin: 0.3 mg/dL (ref 0.3–1.2)
Total Protein: 7.4 g/dL (ref 6.5–8.1)

## 2020-03-18 LAB — CBC WITH DIFFERENTIAL (CANCER CENTER ONLY)
Abs Immature Granulocytes: 0.01 10*3/uL (ref 0.00–0.07)
Basophils Absolute: 0 10*3/uL (ref 0.0–0.1)
Basophils Relative: 1 %
Eosinophils Absolute: 0.2 10*3/uL (ref 0.0–0.5)
Eosinophils Relative: 3 %
HCT: 41.5 % (ref 36.0–46.0)
Hemoglobin: 12.9 g/dL (ref 12.0–15.0)
Immature Granulocytes: 0 %
Lymphocytes Relative: 40 %
Lymphs Abs: 2.2 10*3/uL (ref 0.7–4.0)
MCH: 28.8 pg (ref 26.0–34.0)
MCHC: 31.1 g/dL (ref 30.0–36.0)
MCV: 92.6 fL (ref 80.0–100.0)
Monocytes Absolute: 0.4 10*3/uL (ref 0.1–1.0)
Monocytes Relative: 7 %
Neutro Abs: 2.6 10*3/uL (ref 1.7–7.7)
Neutrophils Relative %: 49 %
Platelet Count: 292 10*3/uL (ref 150–400)
RBC: 4.48 MIL/uL (ref 3.87–5.11)
RDW: 19.9 % — ABNORMAL HIGH (ref 11.5–15.5)
WBC Count: 5.4 10*3/uL (ref 4.0–10.5)
nRBC: 0 % (ref 0.0–0.2)

## 2020-03-18 LAB — RETIC PANEL
Immature Retic Fract: 14.2 % (ref 2.3–15.9)
RBC.: 4.41 MIL/uL (ref 3.87–5.11)
Retic Count, Absolute: 56.4 10*3/uL (ref 19.0–186.0)
Retic Ct Pct: 1.3 % (ref 0.4–3.1)
Reticulocyte Hemoglobin: 33.1 pg (ref >27.9)

## 2020-03-18 LAB — FERRITIN: Ferritin: 29 ng/mL (ref 11–307)

## 2020-03-18 LAB — IRON AND TIBC
Iron: 49 ug/dL (ref 41–142)
Saturation Ratios: 13 % — ABNORMAL LOW (ref 21–57)
TIBC: 371 ug/dL (ref 236–444)
UIBC: 322 ug/dL (ref 120–384)

## 2020-03-18 MED ORDER — VITAMIN D3 25 MCG (1000 UNIT) PO TABS: 1000.0000 [IU] | ORAL_TABLET | Freq: Every day | ORAL | 3 refills | Status: DC

## 2020-03-18 MED ORDER — FERROUS SULFATE 325 (65 FE) MG PO TABS
325.0000 mg | ORAL_TABLET | Freq: Every day | ORAL | 3 refills | Status: DC
Start: 2020-03-18 — End: 2022-09-02

## 2020-03-18 NOTE — Progress Notes (Signed)
[place] [phone]   [phone]  PROGRESS NOTE  Patient Care Team: System, Provider Not In as PCP - General  Hematological/Oncological History # Iron Deficiency Anemia, Unclear Etiology  1)  12/16/2018: WBC 4.8, Hgb 12.6, Plt 334, MCV 92.1.  2)  10/24/2019: WBC 6.3, Hgb 9.2, MCV 84, Plt 481, RDW 17.2%, Cr. 1.2 3) 12/18/2019: Establish care with Dr. Lorenso Courier. WBC 4.4, Hgb 8.9, Plt 366, MCV 78.4. Iron 12, Sat 3%, Ferritin 6, TIBC 431 4) 03/18/2020: WBC 5.4, Hgb 12.9, MCV 92.6, Plt 292. Iron 49, TIBC 371, ferritin 29, Sat 13%  Interval History:  Shigeko Klingsporn 54 y.o. female with medical history significant for iron deficiency anemia presents for a follow up visit. The patient's last visit was on 12/18/2019 at which time she established care. In the interim since the last visit she has received 2 doses of IV iron on 01/01/2020 and 01/08/2020.   On exam today Mrs. Hull notes that there has been an improvement in her energy level which occurred almost immediately after receiving IV iron.  She has she is does still have some degree of fatigue and that she feels that her iron levels are likely "borderline".  She notes that she is able to work better and overall she feels as though her symptoms are improved. Her appetite is good, but she has not made any dietary adjustments to increase PO iron intake.   She reports that she has not noticed any overt signs of bleeding since last time we spoke.  She denies having any issues with bruising, dark stools, or GYN bleeding.  She notes that her issues with vomiting have also resolved since receiving IV iron.  Additionally she has been frequently doing the Epley maneuver in order to try to improve her levels of vertigo.  She currently denies having any issues with fevers, chills, sweats, vomiting, or diarrhea.  A full 10 point ROS is listed below.  MEDICAL HISTORY:  Past Medical History:  Diagnosis Date  . Allergy   . Anxiety    . Arthritis   . Chronic kidney disease   . Depression   . Frequent headaches   . GERD (gastroesophageal reflux disease)   . Iron deficiency anemia 12/19/2019  . Prediabetes   . Right foot pain   . Sinus problem   . Thyroid disease   . Vision problems     SURGICAL HISTORY: Past Surgical History:  Procedure Laterality Date  . sinus surgery   2000  . TONSILLECTOMY AND ADENOIDECTOMY     Sometime in the 1980's    ALLERGIES:  is allergic to egg [eggs or egg-derived products]; fructose; and gluten meal.  MEDICATIONS:  Current Outpatient Medications  Medication Sig Dispense Refill  . Ascorbic Acid (VITAMIN C PO) Take by mouth.    . Calcium-Magnesium-Zinc (CAL-MAG-ZINC PO) Take by mouth.    . Cholecalciferol (VITAMIN D3 PO) Take by mouth.    . Coenzyme Q10 (CO Q 10 PO) Take by mouth.    . Omega-3 Fatty Acids (FISH OIL PO) Take by mouth.    . progesterone (PROMETRIUM) 100 MG capsule     . thyroid (NP THYROID) 30 MG tablet Take 30 mg by mouth daily before breakfast.    . tiZANidine (ZANAFLEX) 4 MG capsule TK 1 C PO TID PRN    . TURMERIC PO Take by mouth.    . Zinc Sulfate (ZINC 15 PO) Take by mouth.     Current Facility-Administered Medications  Medication  Dose Route Frequency Provider Last Rate Last Admin  . 0.9 %  sodium chloride infusion  500 mL Intravenous Once Thornton Park, MD        REVIEW OF SYSTEMS:   Constitutional: ( - ) fevers, ( - )  chills , ( - ) night sweats Eyes: ( - ) blurriness of vision, ( - ) double vision, ( - ) watery eyes Ears, nose, mouth, throat, and face: ( - ) mucositis, ( - ) sore throat Respiratory: ( - ) cough, ( - ) dyspnea, ( - ) wheezes Cardiovascular: ( - ) palpitation, ( - ) chest discomfort, ( - ) lower extremity swelling Gastrointestinal:  ( - ) nausea, ( - ) heartburn, ( - ) change in bowel habits Skin: ( - ) abnormal skin rashes Lymphatics: ( - ) new lymphadenopathy, ( - ) easy bruising Neurological: ( - ) numbness, ( - ) tingling, (  - ) new weaknesses Behavioral/Psych: ( - ) mood change, ( - ) new changes  All other systems were reviewed with the patient and are negative.  PHYSICAL EXAMINATION: ECOG PERFORMANCE STATUS: 1 - Symptomatic but completely ambulatory  Vitals:   03/18/20 1102  BP: (!) 119/95  Pulse: 83  Resp: 20  Temp: 98.2 F (36.8 C)  SpO2: 98%   Filed Weights   03/18/20 1102  Weight: 200 lb (90.7 kg)    GENERAL: well appearing middle aged Caucasian female. alert, no distress and comfortable SKIN: skin color, texture, turgor are normal, no rashes or significant lesions EYES: conjunctiva are pink and non-injected, sclera clear LUNGS: clear to auscultation and percussion with normal breathing effort HEART: regular rate & rhythm and no murmurs and no lower extremity edema Musculoskeletal: no cyanosis of digits and no clubbing  PSYCH: alert & oriented x 3, fluent speech NEURO: no focal motor/sensory deficits  LABORATORY DATA:  I have reviewed the data as listed CBC Latest Ref Rng & Units 03/18/2020 12/18/2019 12/16/2018  WBC 4.0 - 10.5 K/uL 5.4 4.4 4.8  Hemoglobin 12.0 - 15.0 g/dL 12.9 8.9(L) 12.6  Hematocrit 36.0 - 46.0 % 41.5 30.1(L) 38.9  Platelets 150 - 400 K/uL 292 366 334.0    CMP Latest Ref Rng & Units 03/18/2020 12/18/2019 12/23/2018  Glucose 70 - 99 mg/dL 105(H) 89 76  BUN 6 - 20 mg/dL 12 12 14   Creatinine 0.44 - 1.00 mg/dL 1.12(H) 1.10(H) 1.04  Sodium 135 - 145 mmol/L 145 141 140  Potassium 3.5 - 5.1 mmol/L 4.5 4.2 4.5  Chloride 98 - 111 mmol/L 108 108 104  CO2 22 - 32 mmol/L 24 26 27   Calcium 8.9 - 10.3 mg/dL 9.3 9.0 9.4  Total Protein 6.5 - 8.1 g/dL 7.4 7.1 -  Total Bilirubin 0.3 - 1.2 mg/dL 0.3 0.3 -  Alkaline Phos 38 - 126 U/L 90 90 -  AST 15 - 41 U/L 23 22 -  ALT 0 - 44 U/L 19 18 -    No results found for: MPROTEIN Lab Results  Component Value Date   KPAFRELGTCHN 14.5 12/18/2019   LAMBDASER 13.0 12/18/2019   KAPLAMBRATIO 1.12 12/18/2019    RADIOGRAPHIC STUDIES: I have  personally reviewed the radiological images as listed and agreed with the findings in the report. No results found.  ASSESSMENT & PLAN Rhodie Olinde 54 y.o. female with medical history significant for iron deficiency anemia presents for a follow up visit.  After review the labs and discussion with the patient her findings are most consistent with an  iron deficiency anemia that has been adequately treated with IV iron therapy.  She notes that even though she has more energy than previously she still notes some fatigue and feels that there might still be some residual iron deficiency.  We will order the labs today in order to assess and determine if her levels have not improved.  Of note her hemoglobin is back in normal range.  In terms of the cause of iron deficiency anemia does not entirely clear at this time.  There is no evidence of inflammation, GYN bleeding, or GI bleeding based on the EGD and colonoscopy.  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  It is aware that noting that the patient does not consume a lot of iron in her regular diet and this could potentially be the cause.  Overall I would say that if her iron levels continues to decline without clear expansion I would recommend men further pursued with a capsule endoscopy.  Given that her anemia has resolved if she continues to have iron deficiency would be worthwhile to consider p.o. iron supplementation.  We will have the patient return to clinic in approximately 3 months time to reassess and determine her iron status.  #Normocytic Anemia, resolved #Iron Deficiency Anemia, Resolved --today will repeat CBC, CMP and also assess reticulocyte panel and iron panel --if iron deficiency persists/worsens would recommend evaluation with capsule endoscopy. Patient previously had EGD/Colonoscopy without clear source --patient is currently s/p 2  doses of IV feraheme in Feb 2021 --if iron levels are persistently low can consider PO iron supplementation with ferrous sulfate 325mg  PO daily with a source of vitamin C --RTC in 3 months or sooner if new symptoms develop.  No orders of the defined types were placed in this encounter.  All questions were answered. The patient knows to call the clinic with any problems, questions or concerns.  A total of more than 30 minutes were spent on this encounter and over half of that time was spent on counseling and coordination of care as outlined above.   Ledell Peoples, MD Department of Hematology/Oncology [place] at [facility] Phone: [phone] Pager: [phone] Email: [email or web address]  03/18/2020 3:00 PM

## 2020-03-19 ENCOUNTER — Telehealth: Payer: Self-pay | Admitting: Hematology and Oncology

## 2020-03-19 NOTE — Telephone Encounter (Signed)
Scheduled per los. Called and left msg. Mailed printout  °

## 2020-04-04 ENCOUNTER — Other Ambulatory Visit: Payer: Self-pay | Admitting: *Deleted

## 2020-04-04 ENCOUNTER — Telehealth: Payer: Self-pay | Admitting: *Deleted

## 2020-04-04 NOTE — Telephone Encounter (Signed)
-----   Message from Orson Slick, MD sent at 04/04/2020  1:35 PM EDT ----- Please let Caitlyn Page know her iron levels are borderline low (normal ferritin 30, hers is 29). Her Hgb is normal, which is reassuring.  Given this I would recommend starting PO ferrous sulfate 325mg  daily. Please ask which pharmacy we can call this into for her.  Colan Neptune  ----- Message ----- From: Buel Ream, Lab In [place] Sent: 03/18/2020  11:00 AM EDT To: Orson Slick, MD

## 2020-04-04 NOTE — Telephone Encounter (Signed)
TCT patient regarding lab results from 03/18/20.  Spoke with [facility] and advised there Ferritin is border line low.  Dr. Lorenso Courier advises patient take 1 iron tablet daily. Educated patient on how to take po iron. And pt is to return to clinic in 3 months to recheck labs and see Dr. Lorenso Courier. Sparkles voiced understanding.

## 2020-06-17 NOTE — Progress Notes (Signed)
[place] [phone]   [phone]  PROGRESS NOTE  Patient Care Team: System, Provider Not In as PCP - General  Hematological/Oncological History # Iron Deficiency Anemia, Unclear Etiology  1)  12/16/2018: WBC 4.8, Hgb 12.6, Plt 334, MCV 92.1.  2)  10/24/2019: WBC 6.3, Hgb 9.2, MCV 84, Plt 481, RDW 17.2%, Cr. 1.2 3) 12/18/2019: Establish care with Dr. Lorenso Courier. WBC 4.4, Hgb 8.9, Plt 366, MCV 78.4. Iron 12, Sat 3%, Ferritin 6, TIBC 431 4) 03/18/2020: WBC 5.4, Hgb 12.9, MCV 92.6, Plt 292. Iron 49, TIBC 371, ferritin 29, Sat 13% 5) 06/19/2020: WBC 5.8, Hgb 10.7, MCV 93.1, Plt 342  Interval History:  Caitlyn Page 54 y.o. female with medical history significant for iron deficiency anemia presents for a follow up visit. The patient's last visit was on 03/18/2020 at which time she was started on PO iron supplementation due to continued low iron. In the interim since the last visit she has had no hospitalizations or ED visits.   On exam today [name] notes she has had a drop in her high energy back to "normal energy levels". She notes she has fatigue and feels restless. She also reports that stress at work has been worsening and she has been traveling a lot. She has not been taking her iron pills as frequently as she should, noting that she maybe takes them about twice per week.  And she has had stretches where she has not been able to take them appropriately.  On exam today she notes that she has not had any issues with overt signs of bleeding such as nosebleeds, bruising, or dark stools.  She also reports he has not been eating as well as she would like because she has been traveling on the road and staying in hotels.  Otherwise she currently denies having any issues with shortness of breath, chest pain, fevers, chills, sweats, nausea, running or diarrhea.  A full 10 point ROS is listed below.  MEDICAL HISTORY:  Past Medical History:  Diagnosis Date   Allergy     Anxiety    Arthritis    Chronic kidney disease    Depression    Frequent headaches    GERD (gastroesophageal reflux disease)    Iron deficiency anemia 12/19/2019   Prediabetes    Right foot pain    Sinus problem    Thyroid disease    Vision problems     SURGICAL HISTORY: Past Surgical History:  Procedure Laterality Date   sinus surgery   2000   TONSILLECTOMY AND ADENOIDECTOMY     Sometime in the 1980's    ALLERGIES:  is allergic to egg [eggs or egg-derived products], fructose, and gluten meal.  MEDICATIONS:  Current Outpatient Medications  Medication Sig Dispense Refill   Ascorbic Acid (VITAMIN C PO) Take by mouth.     Calcium-Magnesium-Zinc (CAL-MAG-ZINC PO) Take by mouth.     cholecalciferol (VITAMIN D) 25 MCG (1000 UNIT) tablet Take 1 tablet (1,000 Units total) by mouth daily. 90 tablet 3   Coenzyme Q10 (CO Q 10 PO) Take by mouth.     ferrous sulfate 325 (65 FE) MG tablet Take 1 tablet (325 mg total) by mouth daily with breakfast. Please take with a source of Vitamin C 90 tablet 3   Omega-3 Fatty Acids (FISH OIL PO) Take by mouth.     progesterone (PROMETRIUM) 100 MG capsule      thyroid (NP THYROID) 30 MG tablet Take 30 mg by  mouth daily before breakfast.     tiZANidine (ZANAFLEX) 4 MG capsule TK 1 C PO TID PRN     TURMERIC PO Take by mouth.     Zinc Sulfate (ZINC 15 PO) Take by mouth.     Current Facility-Administered Medications  Medication Dose Route Frequency Provider Last Rate Last Admin   0.9 %  sodium chloride infusion  500 mL Intravenous Once Thornton Park, MD        REVIEW OF SYSTEMS:   Constitutional: ( - ) fevers, ( - )  chills , ( - ) night sweats Eyes: ( - ) blurriness of vision, ( - ) double vision, ( - ) watery eyes Ears, nose, mouth, throat, and face: ( - ) mucositis, ( - ) sore throat Respiratory: ( - ) cough, ( - ) dyspnea, ( - ) wheezes Cardiovascular: ( - ) palpitation, ( - ) chest discomfort, ( - ) lower extremity  swelling Gastrointestinal:  ( - ) nausea, ( - ) heartburn, ( - ) change in bowel habits Skin: ( - ) abnormal skin rashes Lymphatics: ( - ) new lymphadenopathy, ( - ) easy bruising Neurological: ( - ) numbness, ( - ) tingling, ( - ) new weaknesses Behavioral/Psych: ( - ) mood change, ( - ) new changes  All other systems were reviewed with the patient and are negative.  PHYSICAL EXAMINATION: ECOG PERFORMANCE STATUS: 1 - Symptomatic but completely ambulatory  Vitals:   06/19/20 0829  BP: 111/85  Pulse: 85  Resp: 17  Temp: 97.9 F (36.6 C)  SpO2: 99%   Filed Weights   06/19/20 0829  Weight: 207 lb (93.9 kg)    GENERAL: well appearing middle aged Caucasian female. alert, no distress and comfortable SKIN: skin color, texture, turgor are normal, no rashes or significant lesions EYES: conjunctiva are pink and non-injected, sclera clear LUNGS: clear to auscultation and percussion with normal breathing effort HEART: regular rate & rhythm and no murmurs and no lower extremity edema Musculoskeletal: no cyanosis of digits and no clubbing  PSYCH: alert & oriented x 3, fluent speech NEURO: no focal motor/sensory deficits  LABORATORY DATA:  I have reviewed the data as listed CBC Latest Ref Rng & Units 06/19/2020 03/18/2020 12/18/2019  WBC 4.0 - 10.5 K/uL 5.8 5.4 4.4  Hemoglobin 12.0 - 15.0 g/dL 10.7(L) 12.9 8.9(L)  Hematocrit 36 - 46 % 33.8(L) 41.5 30.1(L)  Platelets 150 - 400 K/uL 342 292 366    CMP Latest Ref Rng & Units 06/19/2020 03/18/2020 12/18/2019  Glucose 70 - 99 mg/dL 107(H) 105(H) 89  BUN 6 - 20 mg/dL 13 12 12   Creatinine 0.44 - 1.00 mg/dL 1.09(H) 1.12(H) 1.10(H)  Sodium 135 - 145 mmol/L 142 145 141  Potassium 3.5 - 5.1 mmol/L 4.6 4.5 4.2  Chloride 98 - 111 mmol/L 108 108 108  CO2 22 - 32 mmol/L 27 24 26   Calcium 8.9 - 10.3 mg/dL 9.5 9.3 9.0  Total Protein 6.5 - 8.1 g/dL 7.0 7.4 7.1  Total Bilirubin 0.3 - 1.2 mg/dL 0.3 0.3 0.3  Alkaline Phos 38 - 126 U/L 90 90 90  AST 15 - 41  U/L 22 23 22   ALT 0 - 44 U/L 20 19 18     No results found for: MPROTEIN Lab Results  Component Value Date   KPAFRELGTCHN 14.5 12/18/2019   LAMBDASER 13.0 12/18/2019   KAPLAMBRATIO 1.12 12/18/2019    RADIOGRAPHIC STUDIES: No results found.  ASSESSMENT & PLAN Caitlyn Page 54 y.o. female  with medical history significant for iron deficiency anemia presents for a follow up visit.  After review the labs and discussion with the patient her findings are most consistent with an iron deficiency anemia that has been adequately treated with IV iron therapy.    On exam today Ms. Kraemer's hemoglobin has dropped down to 10.7 and she has been experiencing symptoms such as fatigue and low energy.  She has not noted any overt signs of bleeding in the interim since our last visit.  Additionally she has not been entirely compliant with her p.o. iron therapy and her diet has not been supplemented with much iron rich food.  In terms of the cause of iron deficiency anemia does not entirely clear at this time.  There is no evidence of inflammation, GYN bleeding, or GI bleeding based on the EGD and colonoscopy.  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  It is worth noting that the patient does not consume a lot of iron in her regular diet and this could potentially be the cause.  Overall I would say that if her iron levels continues to decline without clear explanation, I would recommend this be further pursued with a capsule endoscopy. We will have the patient return to clinic in approximately 2 months time to reassess and determine her iron status.  #Normocytic Anemia, resolved #Iron Deficiency Anemia, Resolved --today will repeat CBC, CMP and also assess reticulocyte panel and iron panel -- recommend evaluation with capsule endoscopy. Patient previously had EGD/Colonoscopy without clear source of bleeding  noted. Will contact Dr. Tarri Glenn regarding the care of this patient.  --patient is currently s/p 2 doses of IV feraheme in Feb 2021. Will administer 2 doses this month (seperated by more than 1 week due to patient's schedule) --continue PO iron supplementation with ferrous sulfate 325mg  PO daily with a source of vitamin C (started 03/18/2020)  --RTC in 3 months or sooner if new symptoms develop.  No orders of the defined types were placed in this encounter.  All questions were answered. The patient knows to call the clinic with any problems, questions or concerns.  A total of more than 30 minutes were spent on this encounter and over half of that time was spent on counseling and coordination of care as outlined above.   Ledell Peoples, MD Department of Hematology/Oncology [place] at [facility] Phone: [phone] Pager: [phone] Email: [email or web address]  06/19/2020 9:37 AM

## 2020-06-18 ENCOUNTER — Other Ambulatory Visit: Payer: Self-pay | Admitting: Hematology and Oncology

## 2020-06-18 DIAGNOSIS — D509 Iron deficiency anemia, unspecified: Secondary | ICD-10-CM

## 2020-06-19 ENCOUNTER — Inpatient Hospital Stay: Payer: BC Managed Care – PPO

## 2020-06-19 ENCOUNTER — Inpatient Hospital Stay: Payer: BC Managed Care – PPO | Attending: Hematology and Oncology | Admitting: Hematology and Oncology

## 2020-06-19 ENCOUNTER — Other Ambulatory Visit: Payer: Self-pay

## 2020-06-19 ENCOUNTER — Encounter: Payer: Self-pay | Admitting: Hematology and Oncology

## 2020-06-19 VITALS — BP 111/85 | HR 85 | Temp 97.9°F | Resp 17 | Ht 64.0 in | Wt 207.0 lb

## 2020-06-19 DIAGNOSIS — K219 Gastro-esophageal reflux disease without esophagitis: Secondary | ICD-10-CM | POA: Insufficient documentation

## 2020-06-19 DIAGNOSIS — R5383 Other fatigue: Secondary | ICD-10-CM | POA: Insufficient documentation

## 2020-06-19 DIAGNOSIS — Z79899 Other long term (current) drug therapy: Secondary | ICD-10-CM | POA: Insufficient documentation

## 2020-06-19 DIAGNOSIS — D509 Iron deficiency anemia, unspecified: Secondary | ICD-10-CM | POA: Diagnosis present

## 2020-06-19 DIAGNOSIS — D500 Iron deficiency anemia secondary to blood loss (chronic): Secondary | ICD-10-CM | POA: Diagnosis not present

## 2020-06-19 DIAGNOSIS — F419 Anxiety disorder, unspecified: Secondary | ICD-10-CM | POA: Insufficient documentation

## 2020-06-19 DIAGNOSIS — E079 Disorder of thyroid, unspecified: Secondary | ICD-10-CM | POA: Insufficient documentation

## 2020-06-19 DIAGNOSIS — N189 Chronic kidney disease, unspecified: Secondary | ICD-10-CM | POA: Diagnosis not present

## 2020-06-19 DIAGNOSIS — I129 Hypertensive chronic kidney disease with stage 1 through stage 4 chronic kidney disease, or unspecified chronic kidney disease: Secondary | ICD-10-CM | POA: Insufficient documentation

## 2020-06-19 DIAGNOSIS — R42 Dizziness and giddiness: Secondary | ICD-10-CM | POA: Diagnosis not present

## 2020-06-19 DIAGNOSIS — R531 Weakness: Secondary | ICD-10-CM | POA: Diagnosis not present

## 2020-06-19 LAB — CBC WITH DIFFERENTIAL (CANCER CENTER ONLY)
Abs Immature Granulocytes: 0.01 10*3/uL (ref 0.00–0.07)
Basophils Absolute: 0 10*3/uL (ref 0.0–0.1)
Basophils Relative: 1 %
Eosinophils Absolute: 0.2 10*3/uL (ref 0.0–0.5)
Eosinophils Relative: 4 %
HCT: 33.8 % — ABNORMAL LOW (ref 36.0–46.0)
Hemoglobin: 10.7 g/dL — ABNORMAL LOW (ref 12.0–15.0)
Immature Granulocytes: 0 %
Lymphocytes Relative: 33 %
Lymphs Abs: 1.9 10*3/uL (ref 0.7–4.0)
MCH: 29.5 pg (ref 26.0–34.0)
MCHC: 31.7 g/dL (ref 30.0–36.0)
MCV: 93.1 fL (ref 80.0–100.0)
Monocytes Absolute: 0.4 10*3/uL (ref 0.1–1.0)
Monocytes Relative: 7 %
Neutro Abs: 3.2 10*3/uL (ref 1.7–7.7)
Neutrophils Relative %: 55 %
Platelet Count: 342 10*3/uL (ref 150–400)
RBC: 3.63 MIL/uL — ABNORMAL LOW (ref 3.87–5.11)
RDW: 14.3 % (ref 11.5–15.5)
WBC Count: 5.8 10*3/uL (ref 4.0–10.5)
nRBC: 0 % (ref 0.0–0.2)

## 2020-06-19 LAB — CMP (CANCER CENTER ONLY)
ALT: 20 U/L (ref 0–44)
AST: 22 U/L (ref 15–41)
Albumin: 3.6 g/dL (ref 3.5–5.0)
Alkaline Phosphatase: 90 U/L (ref 38–126)
Anion gap: 7 (ref 5–15)
BUN: 13 mg/dL (ref 6–20)
CO2: 27 mmol/L (ref 22–32)
Calcium: 9.5 mg/dL (ref 8.9–10.3)
Chloride: 108 mmol/L (ref 98–111)
Creatinine: 1.09 mg/dL — ABNORMAL HIGH (ref 0.44–1.00)
GFR, Est AFR Am: >60 mL/min (ref >60)
GFR, Estimated: 58 mL/min — ABNORMAL LOW (ref >60)
Glucose, Bld: 107 mg/dL — ABNORMAL HIGH (ref 70–99)
Potassium: 4.6 mmol/L (ref 3.5–5.1)
Sodium: 142 mmol/L (ref 135–145)
Total Bilirubin: 0.3 mg/dL (ref 0.3–1.2)
Total Protein: 7 g/dL (ref 6.5–8.1)

## 2020-06-19 LAB — IRON AND TIBC
Iron: 48 ug/dL (ref 41–142)
Saturation Ratios: 12 % — ABNORMAL LOW (ref 21–57)
TIBC: 395 ug/dL (ref 236–444)
UIBC: 347 ug/dL (ref 120–384)

## 2020-06-19 LAB — RETIC PANEL
Immature Retic Fract: 26.9 % — ABNORMAL HIGH (ref 2.3–15.9)
RBC.: 3.65 MIL/uL — ABNORMAL LOW (ref 3.87–5.11)
Retic Count, Absolute: 86.9 10*3/uL (ref 19.0–186.0)
Retic Ct Pct: 2.4 % (ref 0.4–3.1)
Reticulocyte Hemoglobin: 31.7 pg (ref >27.9)

## 2020-06-19 LAB — FERRITIN: Ferritin: 14 ng/mL (ref 11–307)

## 2020-06-20 ENCOUNTER — Telehealth: Payer: Self-pay | Admitting: Hematology and Oncology

## 2020-06-20 ENCOUNTER — Telehealth: Payer: Self-pay

## 2020-06-20 NOTE — Telephone Encounter (Signed)
Pt needs capsule endo in Sept.

## 2020-06-20 NOTE — Telephone Encounter (Signed)
Scheduled per los. Called and spoke with patient. Confirmed appts  

## 2020-06-20 NOTE — Telephone Encounter (Signed)
-----   Message from Thornton Park, MD sent at 06/19/2020  1:12 PM EDT ----- Please schedule capsule endoscopy in September. Follow-up with me 2 weeks later - as it often takes that long to get the results. Thank you.  ----- Message ----- From: Algernon Huxley, RN Sent: 06/19/2020   1:07 PM EDT To: Thornton Park, MD  Called pt and she is leaving tomorrow and will be in [place] for the month of August. Pt states she would just like to go ahead and schedule the capsule endo in early Sept. Please advise. ----- Message ----- From: Thornton Park, MD Sent: 06/19/2020  12:33 PM EDT To: Algernon Huxley, RN  Vaughan Basta, Please arrange for this patient to have an office visit with me or an APP to discuss further evaluation of anemia with capsule endoscopy.  Thank you.  KLB ----- Message ----- From: Orson Slick, MD Sent: 06/19/2020  10:10 AM EDT To: Algernon Huxley, RN, Thornton Park, MD  Dr. Tarri Glenn,   I have a patient and I clinic by the name Bristyl Mclees who currently has an unexplained iron deficiency anemia.  She was previously seen by you in August 2020 at which time an EGD and colonoscopy were performed with no clear etiology.  Despite IV iron therapy her hemoglobin levels continue to drop and we have not been able to bolster her iron.  I am concerned she may have another source of bleeding.  I was hoping she could be considered for capsule endoscopy.  Please let me know if you have any other thoughts on this case  Thanks,  Armandina Gemma

## 2020-06-21 ENCOUNTER — Inpatient Hospital Stay: Payer: BC Managed Care – PPO

## 2020-06-21 ENCOUNTER — Other Ambulatory Visit: Payer: Self-pay

## 2020-06-21 VITALS — BP 103/75 | HR 66 | Temp 98.1°F | Resp 17

## 2020-06-21 DIAGNOSIS — D509 Iron deficiency anemia, unspecified: Secondary | ICD-10-CM

## 2020-06-21 MED ORDER — SODIUM CHLORIDE 0.9 % IV SOLN
Freq: Once | INTRAVENOUS | Status: AC
  Filled 2020-06-21: qty 250

## 2020-06-21 MED ORDER — SODIUM CHLORIDE 0.9 % IV SOLN
510.0000 mg | Freq: Once | INTRAVENOUS | Status: AC
  Administered 2020-06-21: 510 mg via INTRAVENOUS
  Filled 2020-06-21: qty 510

## 2020-06-21 NOTE — Progress Notes (Signed)
Patient remained for 20 minutes post observation period. Felt fine and VSS prior to DC.

## 2020-07-15 ENCOUNTER — Inpatient Hospital Stay: Payer: BC Managed Care – PPO

## 2020-07-15 ENCOUNTER — Other Ambulatory Visit: Payer: Self-pay

## 2020-07-15 VITALS — BP 94/59 | HR 60 | Temp 97.9°F | Resp 16 | Ht 64.0 in | Wt 191.5 lb

## 2020-07-15 DIAGNOSIS — D509 Iron deficiency anemia, unspecified: Secondary | ICD-10-CM

## 2020-07-15 MED ORDER — SODIUM CHLORIDE 0.9 % IV SOLN
510.0000 mg | Freq: Once | INTRAVENOUS | Status: AC
  Administered 2020-07-15: 510 mg via INTRAVENOUS
  Filled 2020-07-15: qty 510

## 2020-07-15 MED ORDER — SODIUM CHLORIDE 0.9 % IV SOLN
Freq: Once | INTRAVENOUS | Status: AC
  Filled 2020-07-15: qty 250

## 2020-07-15 NOTE — Patient Instructions (Signed)

## 2020-07-26 NOTE — Telephone Encounter (Signed)
Called to schedule capsule endo but pt states she has lost her job and is [employer] for [employer]. Pt wants to be called back next week.

## 2020-08-09 NOTE — Telephone Encounter (Signed)
Pt states she still does not have a job or insurance. States she will call back when/if she is able to get insurance and then schedule procedure.

## 2020-08-19 ENCOUNTER — Other Ambulatory Visit: Payer: BC Managed Care – PPO

## 2020-08-19 ENCOUNTER — Ambulatory Visit: Payer: BC Managed Care – PPO | Admitting: Hematology and Oncology

## 2020-11-28 ENCOUNTER — Encounter (HOSPITAL_COMMUNITY): Payer: Self-pay | Admitting: Urgent Care

## 2020-11-28 ENCOUNTER — Other Ambulatory Visit: Payer: Self-pay

## 2020-11-28 ENCOUNTER — Ambulatory Visit (INDEPENDENT_AMBULATORY_CARE_PROVIDER_SITE_OTHER): Payer: 59

## 2020-11-28 ENCOUNTER — Ambulatory Visit (HOSPITAL_COMMUNITY)
Admission: EM | Admit: 2020-11-28 | Discharge: 2020-11-28 | Disposition: A | Discharge status: Home / Self Care | Payer: 59 | Attending: Urgent Care | Admitting: Urgent Care

## 2020-11-28 ENCOUNTER — Ambulatory Visit (HOSPITAL_COMMUNITY): Payer: 59

## 2020-11-28 DIAGNOSIS — Z20822 Contact with and (suspected) exposure to covid-19: Secondary | ICD-10-CM | POA: Insufficient documentation

## 2020-11-28 DIAGNOSIS — S20212A Contusion of left front wall of thorax, initial encounter: Secondary | ICD-10-CM | POA: Diagnosis present

## 2020-11-28 DIAGNOSIS — R042 Hemoptysis: Secondary | ICD-10-CM

## 2020-11-28 DIAGNOSIS — R0781 Pleurodynia: Secondary | ICD-10-CM | POA: Diagnosis present

## 2020-11-28 DIAGNOSIS — R519 Headache, unspecified: Secondary | ICD-10-CM | POA: Diagnosis present

## 2020-11-28 DIAGNOSIS — W19XXXA Unspecified fall, initial encounter: Secondary | ICD-10-CM | POA: Diagnosis not present

## 2020-11-28 MED ORDER — TIZANIDINE HCL 4 MG PO TABS: 4.0000 mg | ORAL_TABLET | Freq: Three times a day (TID) | ORAL | 0 refills | Status: DC | PRN

## 2020-11-28 NOTE — Discharge Instructions (Addendum)
Please just use Tylenol at a dose of 500mg -650mg  once every 6 hours as needed for your aches, pains, fevers. Do not use any nonsteroidal anti-inflammatories (NSAIDs) like ibuprofen, Motrin, naproxen, Aleve, etc. which are all available over-the-counter.  Okay to use tizanidine with Tylenol.

## 2020-11-28 NOTE — ED Triage Notes (Signed)
Pt here b/c she started coughing blood yest   Also reports she fell 2 weeks ago and inj her ribs on left side associated... Sts her dog pulled her and she fell onto hard wood floor.... Sx also include: dizziness, headache.   Has appt w/GI on 12/09/2020 for an ulcer.   A&O x4... NAD.Caitlyn Page ambulatory

## 2020-11-28 NOTE — ED Provider Notes (Signed)
[place]   MRN: [id number] DOB: 30-Apr-1966  Subjective:   Caitlyn Page is a 55 y.o. female presenting for 2-week history of persistent left-sided rib/thoracic pain.  Symptoms started when she fell onto a hardwood floor from holding onto her dog.  She initially had epigastric pain as well, has a history of peptic ulcer. Also has a history of an hiatal hernia.  Has an appointment to follow-up with her GI doctor for this.  She became more concerned when she started having hemoptysis in the past 2 days.  Also has had a headache.  Patient would like to make sure she does not have pneumonia, rib fracture or COVID-19.  She is vaccinated against COVID and the flu.  Denies history of pulmonary embolism.   Current Facility-Administered Medications:  .  0.9 %  sodium chloride infusion, 500 mL, Intravenous, Once, [place], MD  Current Outpatient Medications:  .  Ascorbic Acid (VITAMIN C PO), Take by mouth., Disp: , Rfl:  .  Calcium-Magnesium-Zinc (CAL-MAG-ZINC PO), Take by mouth., Disp: , Rfl:  .  cholecalciferol (VITAMIN D) 25 MCG (1000 UNIT) tablet, Take 1 tablet (1,000 Units total) by mouth daily., Disp: 90 tablet, Rfl: 3 .  Coenzyme Q10 (CO Q 10 PO), Take by mouth., Disp: , Rfl:  .  ferrous sulfate 325 (65 FE) MG tablet, Take 1 tablet (325 mg total) by mouth daily with breakfast. Please take with a source of Vitamin C, Disp: 90 tablet, Rfl: 3 .  Omega-3 Fatty Acids (FISH OIL PO), Take by mouth., Disp: , Rfl:  .  progesterone (PROMETRIUM) 100 MG capsule, , Disp: , Rfl:  .  thyroid (NP THYROID) 30 MG tablet, Take 30 mg by mouth daily before breakfast., Disp: , Rfl:  .  tiZANidine (ZANAFLEX) 4 MG capsule, TK 1 C PO TID PRN, Disp: , Rfl:  .  TURMERIC PO, Take by mouth., Disp: , Rfl:  .  Zinc Sulfate (ZINC 15 PO), Take by mouth., Disp: , Rfl:    Allergies  Allergen Reactions  . Egg [Eggs Or Egg-Derived Products] Nausea And Vomiting  . Fructose     "probably"  .  Gluten Meal     "maybe"    Past Medical History:  Diagnosis Date  . Allergy   . Anxiety   . Arthritis   . Chronic kidney disease   . Depression   . Frequent headaches   . GERD (gastroesophageal reflux disease)   . Iron deficiency anemia 12/19/2019  . Prediabetes   . Right foot pain   . Sinus problem   . Thyroid disease   . Vision problems      Past Surgical History:  Procedure Laterality Date  . sinus surgery   2000  . TONSILLECTOMY AND ADENOIDECTOMY     Sometime in the 1980's    Family History  Problem Relation Age of Onset  . Breast cancer Mother   . Aneurysm Mother   . Heart Problems Father   . Headache Sister   . Other Sister        vertigo  . Colon cancer Neg Hx   . Esophageal cancer Neg Hx   . Stomach cancer Neg Hx   . Rectal cancer Neg Hx     Social History   Tobacco Use  . Smoking status: Never Smoker  . Smokeless tobacco: Never Used  Vaping Use  . Vaping Use: Never used  Substance Use Topics  . Alcohol use: No  . Drug  use: No    ROS   Objective:   Vitals: BP 115/82 (BP Location: Right Arm)   Pulse 71   Temp 97.9 F (36.6 C) (Oral)   Resp 16   SpO2 98%   Physical Exam Constitutional:      General: She is not in acute distress.    Appearance: Normal appearance. She is well-developed. She is not ill-appearing, toxic-appearing or diaphoretic.  HENT:     Head: Normocephalic and atraumatic.     Nose: Nose normal.     Mouth/Throat:     Mouth: Mucous membranes are moist.  Eyes:     Extraocular Movements: Extraocular movements intact.     Pupils: Pupils are equal, round, and reactive to light.  Cardiovascular:     Rate and Rhythm: Normal rate and regular rhythm.     Pulses: Normal pulses.     Heart sounds: Normal heart sounds. No murmur heard. No friction rub. No gallop.   Pulmonary:     Effort: Pulmonary effort is normal. No respiratory distress.     Breath sounds: Normal breath sounds. No stridor. No wheezing, rhonchi or rales.   Chest:     Chest wall: Tenderness (across area outlined) present.    Skin:    General: Skin is warm and dry.     Findings: No rash.  Neurological:     Mental Status: She is alert and oriented to person, place, and time.  Psychiatric:        Mood and Affect: Mood normal.        Behavior: Behavior normal.        Thought Content: Thought content normal.     DG Chest 2 View  Result Date: 11/28/2020 CLINICAL DATA:  Hemoptysis. Fall 2 weeks ago with injury to left ribs. EXAM: CHEST - 2 VIEW COMPARISON:  None. FINDINGS: The cardiomediastinal contours are normal. Retrocardiac hiatal hernia. Pulmonary vasculature is normal. No consolidation, pleural effusion, or pneumothorax. No acute osseous abnormalities are seen. T12 compression fracture which was also seen on 11/20/2019 thoracic spine MRI. IMPRESSION: 1. No acute findings or explanation for hemoptysis. 2. Retrocardiac hiatal hernia. 3. Left rib series has been ordered separately, which will provide more detailed assessment of ribs. Electronically Signed   By: Keith Rake M.D.   On: 11/28/2020 17:15   DG Ribs Unilateral Left  Result Date: 11/28/2020 CLINICAL DATA:  Hemoptysis.  Fall 2 weeks ago injuring left ribs. EXAM: LEFT RIBS - 2 VIEW COMPARISON:  None. FINDINGS: No fracture or other bone lesions are seen involving the ribs. No pneumothorax or pleural fluid. IMPRESSION: Negative radiographs of the left ribs. Electronically Signed   By: Keith Rake M.D.   On: 11/28/2020 17:16    Assessment and Plan :   PDMP not reviewed this encounter.  1. Rib pain on left side   2. Accidental fall, initial encounter   3. Hemoptysis   4. Generalized headache   5. Rib pain     Suspect rib contusion with possible contributing factor of peptic ulcer disease.  Discussed possibility of acute bronchitis versus pulmonary embolism as sources of hemoptysis.  I have low suspicion for either 1 of these as patient has minimal risk factors and the  chest x-ray for acute bronchitis is normal.  Recommended very close follow-up with her PCP, keep appointment with GI specialist.  Scheduled Tylenol, use tizanidine.  COVID-19 testing pending. Counseled patient on potential for adverse effects with medications prescribed/recommended today, ER and return-to-clinic precautions discussed, patient  verbalized understanding.    Jaynee Eagles, [place] 11/28/20 1741

## 2020-11-29 LAB — SARS CORONAVIRUS 2 (TAT 6-24 HRS): SARS Coronavirus 2: NEGATIVE

## 2021-04-23 ENCOUNTER — Other Ambulatory Visit: Payer: Self-pay

## 2021-04-23 ENCOUNTER — Other Ambulatory Visit: Payer: Self-pay | Admitting: Family Medicine

## 2021-04-23 ENCOUNTER — Ambulatory Visit: Payer: Self-pay

## 2021-04-23 ENCOUNTER — Encounter: Payer: Self-pay | Admitting: Hematology and Oncology

## 2021-04-23 DIAGNOSIS — M545 Low back pain, unspecified: Secondary | ICD-10-CM

## 2021-04-27 ENCOUNTER — Encounter: Payer: Self-pay | Admitting: Hematology and Oncology

## 2021-04-27 ENCOUNTER — Other Ambulatory Visit: Payer: Self-pay

## 2021-04-27 ENCOUNTER — Emergency Department (HOSPITAL_COMMUNITY)
Admission: EM | Admit: 2021-04-27 | Discharge: 2021-04-27 | Disposition: A | Discharge status: Home / Self Care | Payer: PRIVATE HEALTH INSURANCE | Attending: Emergency Medicine | Admitting: Emergency Medicine

## 2021-04-27 ENCOUNTER — Emergency Department (HOSPITAL_COMMUNITY): Payer: PRIVATE HEALTH INSURANCE

## 2021-04-27 DIAGNOSIS — S34101A Unspecified injury to L1 level of lumbar spinal cord, initial encounter: Secondary | ICD-10-CM | POA: Diagnosis not present

## 2021-04-27 DIAGNOSIS — M4856XA Collapsed vertebra, not elsewhere classified, lumbar region, initial encounter for fracture: Secondary | ICD-10-CM | POA: Diagnosis not present

## 2021-04-27 DIAGNOSIS — Q7649 Other congenital malformations of spine, not associated with scoliosis: Secondary | ICD-10-CM | POA: Diagnosis not present

## 2021-04-27 DIAGNOSIS — N189 Chronic kidney disease, unspecified: Secondary | ICD-10-CM | POA: Insufficient documentation

## 2021-04-27 DIAGNOSIS — X58XXXA Exposure to other specified factors, initial encounter: Secondary | ICD-10-CM | POA: Insufficient documentation

## 2021-04-27 DIAGNOSIS — S32010A Wedge compression fracture of first lumbar vertebra, initial encounter for closed fracture: Secondary | ICD-10-CM | POA: Insufficient documentation

## 2021-04-27 DIAGNOSIS — K5900 Constipation, unspecified: Secondary | ICD-10-CM | POA: Diagnosis not present

## 2021-04-27 DIAGNOSIS — M545 Low back pain, unspecified: Secondary | ICD-10-CM

## 2021-04-27 DIAGNOSIS — M2578 Osteophyte, vertebrae: Secondary | ICD-10-CM | POA: Diagnosis not present

## 2021-04-27 DIAGNOSIS — M5126 Other intervertebral disc displacement, lumbar region: Secondary | ICD-10-CM | POA: Diagnosis not present

## 2021-04-27 LAB — CBC
HCT: 35.4 % — ABNORMAL LOW (ref 36.0–46.0)
Hemoglobin: 11.5 g/dL — ABNORMAL LOW (ref 12.0–15.0)
MCH: 31.1 pg (ref 26.0–34.0)
MCHC: 32.5 g/dL (ref 30.0–36.0)
MCV: 95.7 fL (ref 80.0–100.0)
Platelets: 323 10*3/uL (ref 150–400)
RBC: 3.7 MIL/uL — ABNORMAL LOW (ref 3.87–5.11)
RDW: 14.1 % (ref 11.5–15.5)
WBC: 5.6 10*3/uL (ref 4.0–10.5)
nRBC: 0 % (ref 0.0–0.2)

## 2021-04-27 LAB — BASIC METABOLIC PANEL
Anion gap: 8 (ref 5–15)
BUN: 17 mg/dL (ref 6–20)
CO2: 25 mmol/L (ref 22–32)
Calcium: 9.1 mg/dL (ref 8.9–10.3)
Chloride: 105 mmol/L (ref 98–111)
Creatinine, Ser: 0.97 mg/dL (ref 0.44–1.00)
GFR, Estimated: >60 mL/min (ref >60)
Glucose, Bld: 93 mg/dL (ref 70–99)
Potassium: 4.3 mmol/L (ref 3.5–5.1)
Sodium: 138 mmol/L (ref 135–145)

## 2021-04-27 MED ORDER — GOLYTELY 236 G PO SOLR: 4000.0000 mL | Freq: Once | ORAL | 0 refills | Status: AC

## 2021-04-27 MED ORDER — OXYCODONE-ACETAMINOPHEN 5-325 MG PO TABS: 1.0000 | ORAL_TABLET | Freq: Four times a day (QID) | ORAL | 0 refills | Status: DC | PRN

## 2021-04-27 MED ORDER — HYDROMORPHONE HCL 1 MG/ML IJ SOLN
1.0000 mg | Freq: Once | INTRAMUSCULAR | Status: AC
  Administered 2021-04-27: 05:00:00 1 mg via INTRAMUSCULAR
  Filled 2021-04-27: qty 1

## 2021-04-27 NOTE — Discharge Instructions (Addendum)
You were evaluated in the Emergency Department and after careful evaluation, we did not find any emergent condition requiring admission or further testing in the hospital.  Your exam/testing today is overall reassuring.  MRI did not show any emergencies.  Recommend continued follow-up with orthopedics.  Please use the polyethylene glycol medication to help with constipation at home.  MRI showed no evidence of cord compression.  But does show a slight first lumbar vertebrae compression fracture.  Which certainly would cause pain.  But based on your pain location may not be the cause of the significant pain. Take the pain medicine as directed.  Follow-up with sports medicine.  Please return to the Emergency Department if you experience any worsening of your condition.   Thank you for allowing [place] to be a part of your care.

## 2021-04-27 NOTE — ED Notes (Signed)
Pt in MRI at this time 

## 2021-04-27 NOTE — ED Provider Notes (Signed)
MRI showed no evidence of cauda equina.  Did show evidence of an L1 slight compression fracture.  With patient's pain is not really at that level.  Its more below.  Suspect more muscle spasm ongoing due to lifting the heavy box.  Discussed patient's hemoglobin of 11.5 with her.  Occasionally she has some black stools.  She is followed by gastroenterology Dr. Romelle Starcher.  Has had several upper endoscopies known to have a hiatal hernia.  States that she has had an ulcer.  He has ordered medications for the constipation.  I have added on Percocet for the back pain and a referral to Dr. Rosiland Oz sports medicine clinic.   Fredia Sorrow, MD 04/27/21 [phone]

## 2021-04-27 NOTE — ED Provider Notes (Signed)
[facility] Emergency Department Provider Note MRN:  [id number]  Arrival date & time: 04/27/21     Chief Complaint   Back Pain and Constipation   History of Present Illness   Caitlyn Page is a 55 y.o. year-old female with no pertinent past medical history presenting to the ED with chief complaint of back pain and constipation.  Patient fell and sustained injury or pain to her back about 12 days ago, has been struggling ever since.  Pain is located in the lower back, midline and bilaterally.  She went to urgent care and an x-ray was done and was normal.  She is trying multiple muscle relaxers, tramadol, nothing seems to be helping.  She is having trouble walking or getting out of bed, she has urinated on herself twice, she has not had a bowel movement in 8 days.  Denies fever, no numbness or weakness to the arms or legs, no chest pain, no abdominal pain.  Review of Systems  A complete 10 system review of systems was obtained and all systems are negative except as noted in the HPI and PMH.   Patient's Health History    Past Medical History:  Diagnosis Date   Allergy    Anxiety    Arthritis    Chronic kidney disease    Depression    Frequent headaches    GERD (gastroesophageal reflux disease)    Iron deficiency anemia 12/19/2019   Prediabetes    Right foot pain    Sinus problem    Thyroid disease    Vision problems     Past Surgical History:  Procedure Laterality Date   sinus surgery   2000   TONSILLECTOMY AND ADENOIDECTOMY     Sometime in the 42's    Family History  Problem Relation Age of Onset   Breast cancer Mother    Aneurysm Mother    Heart Problems Father    Headache Sister    Other Sister        vertigo   Colon cancer Neg Hx    Esophageal cancer Neg Hx    Stomach cancer Neg Hx    Rectal cancer Neg Hx     Social History   Socioeconomic History   Marital status: Significant Other    Spouse name: Not on file   Number of children:  0   Years of education: Not on file   Highest education level: Not on file  Occupational History   Not on file  Tobacco Use   Smoking status: Never   Smokeless tobacco: Never  Vaping Use   Vaping Use: Never used  Substance and Sexual Activity   Alcohol use: No   Drug use: No   Sexual activity: Not on file  Other Topics Concern   Not on file  Social History Narrative   Lives at home alone    Right handed   Caffeine: a few times per week   Social Determinants of Health   Financial Resource Strain: Not on file  Food Insecurity: Not on file  Transportation Needs: Not on file  Physical Activity: Not on file  Stress: Not on file  Social Connections: Not on file  Intimate Partner Violence: Not on file     Physical Exam   Vitals:   04/27/21 0530 04/27/21 0600  BP: 92/68 93/73  Pulse: 70 64  Resp:  20  Temp:    SpO2: 95% 92%    CONSTITUTIONAL: Well-appearing, NAD NEURO:  Alert and  oriented x 3, no focal deficits EYES:  eyes equal and reactive ENT/NECK:  no LAD, no JVD CARDIO: Regular rate, well-perfused, normal S1 and S2 PULM:  CTAB no wheezing or rhonchi GI/GU:  normal bowel sounds, non-distended, non-tender MSK/SPINE:  No gross deformities, no edema SKIN:  no rash, atraumatic PSYCH:  Appropriate speech and behavior  *Additional and/or pertinent findings included in MDM below  Diagnostic and Interventional Summary    EKG Interpretation  Date/Time:    Ventricular Rate:    PR Interval:    QRS Duration:   QT Interval:    QTC Calculation:   R Axis:     Text Interpretation:          Labs Reviewed  CBC - Abnormal; Notable for the following components:      Result Value   RBC 3.70 (*)    Hemoglobin 11.5 (*)    HCT 35.4 (*)    All other components within normal limits  BASIC METABOLIC PANEL    MR LUMBAR SPINE WO CONTRAST    (Results Pending)    Medications  HYDROmorphone (DILAUDID) injection 1 mg (1 mg Intramuscular Given 04/27/21 0438)      Procedures  /  Critical Care Procedures  ED Course and Medical Decision Making  I have reviewed the triage vital signs, the nursing notes, and pertinent available records from the EMR.  Listed above are laboratory and imaging tests that I personally ordered, reviewed, and interpreted and then considered in my medical decision making (see below for details).  MRI to exclude cauda equina or myelopathy.  Abdomen soft and nontender, will need advanced bowel regiment at home if discharged.  Signed out to oncoming provider shift change       Barth Kirks. [place] Small, [place] [email or web address]  Final Clinical Impressions(s) / ED Diagnoses     ICD-10-CM   1. Low back pain, unspecified back pain laterality, unspecified chronicity, unspecified whether sciatica present  M54.50     2. Constipation, unspecified constipation type  K59.00          Discharge Instructions Discussed with and Provided to Patient:        Maudie Flakes, MD 04/27/21 [phone]

## 2021-04-27 NOTE — ED Triage Notes (Signed)
Pt came in with c/o lower back pain bilateral. Pt states that she went to [facility] last Friday and they prescribed her tizanidine. Pt then went to her PCP due to it not improving and they prescribed Flexeril and Tramadol. Pt states that her back has been having spasms. Pt states that at times she feels like her legs are paralyzed and she states that she has lost bladder control twice. Pt states that she is also constipated and hasn't used the bathroom in eight days. Pt came in with a cane and does not use a cane normally at home.

## 2021-04-29 ENCOUNTER — Ambulatory Visit (INDEPENDENT_AMBULATORY_CARE_PROVIDER_SITE_OTHER): Payer: PRIVATE HEALTH INSURANCE | Admitting: Family Medicine

## 2021-04-29 ENCOUNTER — Encounter: Payer: Self-pay | Admitting: Family Medicine

## 2021-04-29 ENCOUNTER — Other Ambulatory Visit: Payer: Self-pay

## 2021-04-29 VITALS — BP 110/80 | Ht 64.0 in | Wt 175.0 lb

## 2021-04-29 DIAGNOSIS — M8080XA Other osteoporosis with current pathological fracture, unspecified site, initial encounter for fracture: Secondary | ICD-10-CM | POA: Insufficient documentation

## 2021-04-29 DIAGNOSIS — S32010A Wedge compression fracture of first lumbar vertebra, initial encounter for closed fracture: Secondary | ICD-10-CM | POA: Diagnosis not present

## 2021-04-29 DIAGNOSIS — K5900 Constipation, unspecified: Secondary | ICD-10-CM | POA: Insufficient documentation

## 2021-04-29 MED ORDER — CALCITONIN (SALMON) 200 UNIT/ACT NA SOLN: 1.0000 | Freq: Every day | NASAL | 11 refills | Status: DC

## 2021-04-29 MED ORDER — SENNOSIDES-DOCUSATE SODIUM 8.6-50 MG PO TABS: 2.0000 | ORAL_TABLET | Freq: Two times a day (BID) | ORAL | 0 refills | Status: DC

## 2021-04-29 NOTE — Progress Notes (Signed)
Caitlyn Page - 55 y.o. female MRN [id number]  Date of birth: 07-Sep-1966  SUBJECTIVE:  Including CC & ROS.  No chief complaint on file.   Caitlyn Page is a 55 y.o. female that is presenting with back pain after an injury sustained at work.  She has a history of compression fracture from years ago.  She was working last week and felt a pop when she is bending over at work.  She was seen in a outside clinic and provided muscle relaxer.  Pain is been ongoing since that event and was seen in the emergency department.  MRI was revealing for a acute compression fracture L1.  She does get improvement with a rigid brace..  Independent review of the MRI lumbar spine from 6/12 shows a compression fracture at L1.   Review of Systems See HPI   HISTORY: Past Medical, Surgical, Social, and Family History Reviewed & Updated per EMR.   Pertinent Historical Findings include:  Past Medical History:  Diagnosis Date   Allergy    Anxiety    Arthritis    Chronic kidney disease    Depression    Frequent headaches    GERD (gastroesophageal reflux disease)    Iron deficiency anemia 12/19/2019   Prediabetes    Right foot pain    Sinus problem    Thyroid disease    Vision problems     Past Surgical History:  Procedure Laterality Date   sinus surgery   2000   TONSILLECTOMY AND ADENOIDECTOMY     Sometime in the 74's    Family History  Problem Relation Age of Onset   Breast cancer Mother    Aneurysm Mother    Heart Problems Father    Headache Sister    Other Sister        vertigo   Colon cancer Neg Hx    Esophageal cancer Neg Hx    Stomach cancer Neg Hx    Rectal cancer Neg Hx     Social History   Socioeconomic History   Marital status: Significant Other    Spouse name: Not on file   Number of children: 0   Years of education: Not on file   Highest education level: Not on file  Occupational History   Not on file  Tobacco Use   Smoking status: Never   Smokeless tobacco: Never   Vaping Use   Vaping Use: Never used  Substance and Sexual Activity   Alcohol use: No   Drug use: No   Sexual activity: Not on file  Other Topics Concern   Not on file  Social History Narrative   Lives at home alone    Right handed   Caffeine: a few times per week   Social Determinants of Health   Financial Resource Strain: Not on file  Food Insecurity: Not on file  Transportation Needs: Not on file  Physical Activity: Not on file  Stress: Not on file  Social Connections: Not on file  Intimate Partner Violence: Not on file     PHYSICAL EXAM:  VS: BP 110/80 (BP Location: Left Arm, Patient Position: Sitting, Cuff Size: Large)   Ht 5\' 4"  (1.626 m)   Wt 175 lb (79.4 kg)   BMI 30.04 kg/m  Physical Exam Gen: NAD, alert, cooperative with exam, well-appearing MSK:  Back: Limited range of motion. Some tenderness to palpation of the right paraspinal muscle. No swelling or ecchymosis. Normal strength with hip flexion. +2 deep tendon reflexes at the  patella and Achilles. Normal plantar flexion dorsiflexion. Neurovascularly intact     ASSESSMENT & PLAN:   Compression fracture of L1 lumbar vertebra (HCC) MRI is demonstrating an acute compression fracture L1 that is likely the source of her pain.  Reports her pain started after an injury at work. -Counseled on home exercise therapy and supportive care. -Calcitonin. -Continue back brace. -Has a history of ulcers so avoiding anti-inflammatories.  Currently dealing with constipation so avoiding opiates.  Localized osteoporosis with current pathological fracture Has a history of compression fractures at different levels.  Qualifying for osteoporosis given her history of current fracture. -Bone scan. -Pursue Evenity.  Constipation Reports not having a bowel movement over the past 14 days.  Is having normal voids. -Senokot. -Counseled on fiber.

## 2021-04-29 NOTE — Assessment & Plan Note (Signed)
Has a history of compression fractures at different levels.  Qualifying for osteoporosis given her history of current fracture. -Bone scan. -Pursue Evenity.

## 2021-04-29 NOTE — Assessment & Plan Note (Signed)
Reports not having a bowel movement over the past 14 days.  Is having normal voids. -Senokot. -Counseled on fiber.

## 2021-04-29 NOTE — Patient Instructions (Signed)
Nice to meet you Please continue the brace Please get the nasal spray  Please continue fiber.  Please try the senokot and try an enema.  We will inform about the evenity  Please schedule the bone scan down stairs   Please send me a message in MyChart with any questions or updates.  Please see me back in 2 weeks.   --Dr. Raeford Razor

## 2021-04-29 NOTE — Assessment & Plan Note (Signed)
MRI is demonstrating an acute compression fracture L1 that is likely the source of her pain.  Reports her pain started after an injury at work. -Counseled on home exercise therapy and supportive care. -Calcitonin. -Continue back brace. -Has a history of ulcers so avoiding anti-inflammatories.  Currently dealing with constipation so avoiding opiates.

## 2021-05-02 ENCOUNTER — Encounter: Payer: Self-pay | Admitting: Hematology and Oncology

## 2021-05-02 DIAGNOSIS — E039 Hypothyroidism, unspecified: Secondary | ICD-10-CM | POA: Diagnosis not present

## 2021-05-02 DIAGNOSIS — N951 Menopausal and female climacteric states: Secondary | ICD-10-CM | POA: Diagnosis not present

## 2021-05-02 DIAGNOSIS — D509 Iron deficiency anemia, unspecified: Secondary | ICD-10-CM | POA: Diagnosis not present

## 2021-05-02 DIAGNOSIS — S32010A Wedge compression fracture of first lumbar vertebra, initial encounter for closed fracture: Secondary | ICD-10-CM | POA: Diagnosis not present

## 2021-05-05 ENCOUNTER — Other Ambulatory Visit: Payer: Self-pay

## 2021-05-05 ENCOUNTER — Ambulatory Visit (HOSPITAL_BASED_OUTPATIENT_CLINIC_OR_DEPARTMENT_OTHER)
Admission: RE | Admit: 2021-05-05 | Discharge: 2021-05-05 | Disposition: A | Discharge status: Home / Self Care | Payer: BC Managed Care – PPO | Source: Ambulatory Visit | Attending: Family Medicine | Admitting: Family Medicine

## 2021-05-05 DIAGNOSIS — S32010A Wedge compression fracture of first lumbar vertebra, initial encounter for closed fracture: Secondary | ICD-10-CM | POA: Diagnosis not present

## 2021-05-05 DIAGNOSIS — M8589 Other specified disorders of bone density and structure, multiple sites: Secondary | ICD-10-CM | POA: Diagnosis not present

## 2021-05-05 DIAGNOSIS — Z780 Asymptomatic menopausal state: Secondary | ICD-10-CM | POA: Diagnosis not present

## 2021-05-14 ENCOUNTER — Telehealth: Payer: Self-pay | Admitting: Family Medicine

## 2021-05-14 NOTE — Telephone Encounter (Signed)
Informed of results.   Rosemarie Ax, MD [facility] 05/14/2021, 1:50 PM

## 2021-05-21 DIAGNOSIS — K257 Chronic gastric ulcer without hemorrhage or perforation: Secondary | ICD-10-CM | POA: Diagnosis not present

## 2021-05-21 DIAGNOSIS — K5909 Other constipation: Secondary | ICD-10-CM | POA: Diagnosis not present

## 2021-05-22 ENCOUNTER — Telehealth: Payer: Self-pay

## 2021-05-22 NOTE — Telephone Encounter (Signed)
Pt ready for scheduling  Out-of-pocket cost due at time of visit: $285.71  Primary: [employer] [place] co-insurance: 0% Admin fee co-insurance: 0%  Secondary: n/a [place] co-insurance:  Admin fee co-insurance:   Deductible: $2,119.41 of $6,000 met; $285.71 remaining  Prior Auth: not required PA# Valid:

## 2021-05-22 NOTE — Telephone Encounter (Signed)
This patient's Evenity is to be paid for by [employer] comp:  Please see WC info provided by patient below:  [place] [place] [address]  Fax: [phone] Acct #: [id number] Attn: Alphonsa Overall.

## 2021-06-04 ENCOUNTER — Encounter: Payer: Self-pay | Admitting: Hematology and Oncology

## 2021-06-18 DIAGNOSIS — Z20822 Contact with and (suspected) exposure to covid-19: Secondary | ICD-10-CM | POA: Diagnosis not present

## 2021-06-19 ENCOUNTER — Telehealth: Payer: Self-pay | Admitting: Family Medicine

## 2021-06-19 NOTE — Telephone Encounter (Signed)
Mora Appl @ [employer] ph# [phone]  called to request pt's last OV notes, need to review for determine if addt'l trmt  -   Email address  : [email or web address]

## 2021-06-23 DIAGNOSIS — R42 Dizziness and giddiness: Secondary | ICD-10-CM | POA: Diagnosis not present

## 2021-06-23 DIAGNOSIS — J029 Acute pharyngitis, unspecified: Secondary | ICD-10-CM | POA: Diagnosis not present

## 2021-06-23 DIAGNOSIS — E669 Obesity, unspecified: Secondary | ICD-10-CM | POA: Diagnosis not present

## 2021-06-23 DIAGNOSIS — Z6832 Body mass index (BMI) 32.0-32.9, adult: Secondary | ICD-10-CM | POA: Diagnosis not present

## 2021-06-23 DIAGNOSIS — H6501 Acute serous otitis media, right ear: Secondary | ICD-10-CM | POA: Diagnosis not present

## 2021-07-14 NOTE — Telephone Encounter (Signed)
Called Worker's Comp at [phone] and left VM requesting a call back regarding Acct #: [id number]

## 2021-08-11 ENCOUNTER — Ambulatory Visit: Payer: BC Managed Care – PPO | Admitting: Family Medicine

## 2021-08-11 NOTE — Progress Notes (Deleted)
  Caitlyn Page - 55 y.o. female MRN [id number]  Date of birth: 11/05/1966  SUBJECTIVE:  Including CC & ROS.  No chief complaint on file.   Caitlyn Page is a 55 y.o. female that is  ***.  ***   Review of Systems See HPI   HISTORY: Past Medical, Surgical, Social, and Family History Reviewed & Updated per EMR.   Pertinent Historical Findings include:  Past Medical History:  Diagnosis Date   Allergy    Anxiety    Arthritis    Chronic kidney disease    Depression    Frequent headaches    GERD (gastroesophageal reflux disease)    Iron deficiency anemia 12/19/2019   Prediabetes    Right foot pain    Sinus problem    Thyroid disease    Vision problems     Past Surgical History:  Procedure Laterality Date   sinus surgery   2000   TONSILLECTOMY AND ADENOIDECTOMY     Sometime in the 38's    Family History  Problem Relation Age of Onset   Breast cancer Mother    Aneurysm Mother    Heart Problems Father    Headache Sister    Other Sister        vertigo   Colon cancer Neg Hx    Esophageal cancer Neg Hx    Stomach cancer Neg Hx    Rectal cancer Neg Hx     Social History   Socioeconomic History   Marital status: Married    Spouse name: Not on file   Number of children: 0   Years of education: Not on file   Highest education level: Not on file  Occupational History   Not on file  Tobacco Use   Smoking status: Never   Smokeless tobacco: Never  Vaping Use   Vaping Use: Never used  Substance and Sexual Activity   Alcohol use: No   Drug use: No   Sexual activity: Not on file  Other Topics Concern   Not on file  Social History Narrative   Lives at home alone    Right handed   Caffeine: a few times per week   Social Determinants of Health   Financial Resource Strain: Not on file  Food Insecurity: Not on file  Transportation Needs: Not on file  Physical Activity: Not on file  Stress: Not on file  Social Connections: Not on file  Intimate Partner  Violence: Not on file     PHYSICAL EXAM:  VS: There were no vitals taken for this visit. Physical Exam Gen: NAD, alert, cooperative with exam, well-appearing MSK:  ***      ASSESSMENT & PLAN:   No problem-specific Assessment & Plan notes found for this encounter.

## 2021-08-25 ENCOUNTER — Ambulatory Visit (INDEPENDENT_AMBULATORY_CARE_PROVIDER_SITE_OTHER): Payer: PRIVATE HEALTH INSURANCE | Admitting: Family Medicine

## 2021-08-25 ENCOUNTER — Ambulatory Visit (HOSPITAL_BASED_OUTPATIENT_CLINIC_OR_DEPARTMENT_OTHER)
Admission: RE | Admit: 2021-08-25 | Discharge: 2021-08-25 | Disposition: A | Discharge status: Home / Self Care | Payer: BC Managed Care – PPO | Source: Ambulatory Visit | Attending: Family Medicine | Admitting: Family Medicine

## 2021-08-25 ENCOUNTER — Other Ambulatory Visit: Payer: Self-pay

## 2021-08-25 VITALS — Ht 64.0 in | Wt 180.0 lb

## 2021-08-25 DIAGNOSIS — M47816 Spondylosis without myelopathy or radiculopathy, lumbar region: Secondary | ICD-10-CM | POA: Diagnosis not present

## 2021-08-25 DIAGNOSIS — S32010D Wedge compression fracture of first lumbar vertebra, subsequent encounter for fracture with routine healing: Secondary | ICD-10-CM | POA: Insufficient documentation

## 2021-08-25 DIAGNOSIS — M8080XD Other osteoporosis with current pathological fracture, unspecified site, subsequent encounter for fracture with routine healing: Secondary | ICD-10-CM | POA: Diagnosis not present

## 2021-08-25 DIAGNOSIS — S32010A Wedge compression fracture of first lumbar vertebra, initial encounter for closed fracture: Secondary | ICD-10-CM | POA: Diagnosis not present

## 2021-08-25 DIAGNOSIS — S22080A Wedge compression fracture of T11-T12 vertebra, initial encounter for closed fracture: Secondary | ICD-10-CM | POA: Diagnosis not present

## 2021-08-25 DIAGNOSIS — Z8739 Personal history of other diseases of the musculoskeletal system and connective tissue: Secondary | ICD-10-CM | POA: Diagnosis not present

## 2021-08-25 NOTE — Progress Notes (Signed)
  Caitlyn Page - 55 y.o. female MRN [id number]  Date of birth: 1966/05/22  SUBJECTIVE:  Including CC & ROS.  No chief complaint on file.   Caitlyn Page is a 55 y.o. female that is presenting with ongoing and worsening back pain after an injury she sustained at work.  She has continued to use the brace to help with the pain.  Has been using the calcitonin.  She has been in different cities since her initial consultation.    Review of Systems See HPI   HISTORY: Past Medical, Surgical, Social, and Family History Reviewed & Updated per EMR.   Pertinent Historical Findings include:  Past Medical History:  Diagnosis Date   Allergy    Anxiety    Arthritis    Chronic kidney disease    Depression    Frequent headaches    GERD (gastroesophageal reflux disease)    Iron deficiency anemia 12/19/2019   Prediabetes    Right foot pain    Sinus problem    Thyroid disease    Vision problems     Past Surgical History:  Procedure Laterality Date   sinus surgery   2000   TONSILLECTOMY AND ADENOIDECTOMY     Sometime in the 75's    Family History  Problem Relation Age of Onset   Breast cancer Mother    Aneurysm Mother    Heart Problems Father    Headache Sister    Other Sister        vertigo   Colon cancer Neg Hx    Esophageal cancer Neg Hx    Stomach cancer Neg Hx    Rectal cancer Neg Hx     Social History   Socioeconomic History   Marital status: Married    Spouse name: Not on file   Number of children: 0   Years of education: Not on file   Highest education level: Not on file  Occupational History   Not on file  Tobacco Use   Smoking status: Never   Smokeless tobacco: Never  Vaping Use   Vaping Use: Never used  Substance and Sexual Activity   Alcohol use: No   Drug use: No   Sexual activity: Not on file  Other Topics Concern   Not on file  Social History Narrative   Lives at home alone    Right handed   Caffeine: a few times per week   Social Determinants of  Health   Financial Resource Strain: Not on file  Food Insecurity: Not on file  Transportation Needs: Not on file  Physical Activity: Not on file  Stress: Not on file  Social Connections: Not on file  Intimate Partner Violence: Not on file     PHYSICAL EXAM:  VS: Ht 5\' 4"  (1.626 m)   Wt 180 lb (81.6 kg)   BMI 30.90 kg/m  Physical Exam Gen: NAD, alert, cooperative with exam, well-appearing      ASSESSMENT & PLAN:   Localized osteoporosis with current pathological fracture Has been taking the calcitonin to help try to stabilize the fracture.  Has history of fracture with current fracture  -Pursue Prolia   Compression fracture of L1 lumbar vertebra (HCC) Continues to use the brace and calcitonin to help with the pain.  Occurred after an injury at work. -Counseled on home exercise therapy and supportive care. -Counseled on brace. -Lumbar and thoracic x-ray. -Referral to physical therapy. -Pursue Prolia.

## 2021-08-25 NOTE — Assessment & Plan Note (Signed)
Has been taking the calcitonin to help try to stabilize the fracture.  Has history of fracture with current fracture  -Pursue Prolia

## 2021-08-25 NOTE — Patient Instructions (Signed)
Good to see you I will call with the results from today  We'll try physical therapy  We can pursue prolia   Please send me a message in MyChart with any questions or updates.  Please see me back in 4 weeks.   --Dr. Raeford Razor

## 2021-08-25 NOTE — Assessment & Plan Note (Signed)
Continues to use the brace and calcitonin to help with the pain.  Occurred after an injury at work. -Counseled on home exercise therapy and supportive care. -Counseled on brace. -Lumbar and thoracic x-ray. -Referral to physical therapy. -Pursue Prolia.

## 2021-08-27 ENCOUNTER — Telehealth: Payer: Self-pay | Admitting: Family Medicine

## 2021-08-27 NOTE — Telephone Encounter (Signed)
Rcvd call frm Mora Appl @ [facility] carrier) requested OV notes be send to her email or fax # ..  Emailed : [email or web address].  Fax [phone]  --submitted- glh

## 2021-08-28 NOTE — Telephone Encounter (Signed)
Informed of results. Given xrays, continue with evenity.   Rosemarie Ax, MD [facility] 08/28/2021, 4:26 PM

## 2021-08-29 NOTE — Telephone Encounter (Signed)
Letter with cost of Evenity and admin fee sent to [employer], Mora Appl via email, per [employer]. Engineer, building services. See letters.

## 2021-09-17 NOTE — Telephone Encounter (Signed)
Patient needs Prolia. This is the same one we tried to get [facility] to pay for her Evenity.   However, now patient states she has been in touch with Mora Appl @ [facility] and patient thinks it will work now for her Florien. See 08/25/21 ov note.  We will disregard everything prior about Evenity. Please use 04/29/21 and 08/25/21 ov notes if they ask for them. Let me know if you have questions or ask Raeford Razor. Thank you!! SS/CMA

## 2021-09-29 ENCOUNTER — Ambulatory Visit: Payer: BC Managed Care – PPO | Admitting: Family Medicine

## 2021-11-20 ENCOUNTER — Encounter: Payer: Self-pay | Admitting: Family Medicine

## 2021-11-20 ENCOUNTER — Ambulatory Visit (INDEPENDENT_AMBULATORY_CARE_PROVIDER_SITE_OTHER): Payer: PRIVATE HEALTH INSURANCE | Admitting: Family Medicine

## 2021-11-20 VITALS — BP 120/82 | Ht 64.0 in | Wt 180.0 lb

## 2021-11-20 DIAGNOSIS — S32010G Wedge compression fracture of first lumbar vertebra, subsequent encounter for fracture with delayed healing: Secondary | ICD-10-CM | POA: Diagnosis not present

## 2021-11-20 DIAGNOSIS — M545 Low back pain, unspecified: Secondary | ICD-10-CM | POA: Insufficient documentation

## 2021-11-20 DIAGNOSIS — S22080A Wedge compression fracture of T11-T12 vertebra, initial encounter for closed fracture: Secondary | ICD-10-CM | POA: Insufficient documentation

## 2021-11-20 DIAGNOSIS — S22080S Wedge compression fracture of T11-T12 vertebra, sequela: Secondary | ICD-10-CM | POA: Diagnosis not present

## 2021-11-20 DIAGNOSIS — M8080XG Other osteoporosis with current pathological fracture, unspecified site, subsequent encounter for fracture with delayed healing: Secondary | ICD-10-CM | POA: Diagnosis not present

## 2021-11-20 MED ORDER — ALENDRONATE SODIUM 70 MG PO TABS: 70.0000 mg | ORAL_TABLET | ORAL | 11 refills | Status: DC

## 2021-11-20 NOTE — Assessment & Plan Note (Signed)
Acutely occurring.  This pain is more lower in the lumbar spine as opposed to order her compression fractures are.  Likely associated with transitional anatomy that is been seen on the previous MRI. -Counseled on home exercise therapy and supportive care. -May consider facet injections or SI joint injection.

## 2021-11-20 NOTE — Patient Instructions (Signed)
Good to see you Please try heat  Please try the water or cycling  Please try the different physical therapy  Please start the medicine   Please send me a message in MyChart with any questions or updates.  Please see me back in 4-6 weeks.   --Dr. Raeford Razor

## 2021-11-20 NOTE — Assessment & Plan Note (Signed)
Has not been able to start medication yet.  Continues to have pain. -Counseled on home exercise therapy and supportive care. -She will try transitioning physical therapy offices. -Initiated Fosamax

## 2021-11-20 NOTE — Assessment & Plan Note (Signed)
Likely contributing to part of her back pain. -Counseled on home exercise therapy and supportive care.

## 2021-11-20 NOTE — Progress Notes (Signed)
Caitlyn Page - 56 y.o. female MRN [id number]  Date of birth: 07-28-1966  SUBJECTIVE:  Including CC & ROS.  No chief complaint on file.   Caitlyn Page is a 56 y.o. female that is presenting with acute low back pain with ongoing compression fracture at L1 and a chronic compression fracture at T12.  Has been through physical therapy with limited improvement.  Pain is still ongoing in the lower portion of her back.   Review of Systems See HPI   HISTORY: Past Medical, Surgical, Social, and Family History Reviewed & Updated per EMR.   Pertinent Historical Findings include:  Past Medical History:  Diagnosis Date   Allergy    Anxiety    Arthritis    Chronic kidney disease    Depression    Frequent headaches    GERD (gastroesophageal reflux disease)    Iron deficiency anemia 12/19/2019   Prediabetes    Right foot pain    Sinus problem    Thyroid disease    Vision problems     Past Surgical History:  Procedure Laterality Date   sinus surgery   2000   TONSILLECTOMY AND ADENOIDECTOMY     Sometime in the 78's    Family History  Problem Relation Age of Onset   Breast cancer Mother    Aneurysm Mother    Heart Problems Father    Headache Sister    Other Sister        vertigo   Colon cancer Neg Hx    Esophageal cancer Neg Hx    Stomach cancer Neg Hx    Rectal cancer Neg Hx     Social History   Socioeconomic History   Marital status: Married    Spouse name: Not on file   Number of children: 0   Years of education: Not on file   Highest education level: Not on file  Occupational History   Not on file  Tobacco Use   Smoking status: Never   Smokeless tobacco: Never  Vaping Use   Vaping Use: Never used  Substance and Sexual Activity   Alcohol use: No   Drug use: No   Sexual activity: Not on file  Other Topics Concern   Not on file  Social History Narrative   Lives at home alone    Right handed   Caffeine: a few times per week   Social Determinants of Health    Financial Resource Strain: Not on file  Food Insecurity: Not on file  Transportation Needs: Not on file  Physical Activity: Not on file  Stress: Not on file  Social Connections: Not on file  Intimate Partner Violence: Not on file     PHYSICAL EXAM:  VS: BP 120/82 (BP Location: Left Arm, Patient Position: Sitting)    Ht 5\' 4"  (1.626 m)    Wt 180 lb (81.6 kg)    BMI 30.90 kg/m  Physical Exam Gen: NAD, alert, cooperative with exam, well-appearing   ASSESSMENT & PLAN:   Localized osteoporosis with current pathological fracture Has been unable to start the Prolia and Evenity due to financial burdens.  Has had multiple compression fractures and would benefit more from that injectable.  She has ongoing pain that likely stems from the ongoing compression fracture  -We will go ahead and start alendronate.   Compression fracture of L1 lumbar vertebra (HCC) Has not been able to start medication yet.  Continues to have pain. -Counseled on home exercise therapy and supportive care. -She will  try transitioning physical therapy offices. -Initiated Fosamax  Compression fracture of T12 vertebra ([place]) Likely contributing to part of her back pain. -Counseled on home exercise therapy and supportive care.   Acute bilateral low back pain without sciatica Acutely occurring.  This pain is more lower in the lumbar spine as opposed to order her compression fractures are.  Likely associated with transitional anatomy that is been seen on the previous MRI. -Counseled on home exercise therapy and supportive care. -May consider facet injections or SI joint injection.

## 2021-11-20 NOTE — Assessment & Plan Note (Signed)
Has been unable to start the Prolia and Evenity due to financial burdens.  Has had multiple compression fractures and would benefit more from that injectable.  She has ongoing pain that likely stems from the ongoing compression fracture  -We will go ahead and start alendronate.

## 2021-12-09 DIAGNOSIS — Z1231 Encounter for screening mammogram for malignant neoplasm of breast: Secondary | ICD-10-CM | POA: Diagnosis not present

## 2021-12-11 DIAGNOSIS — E039 Hypothyroidism, unspecified: Secondary | ICD-10-CM | POA: Diagnosis not present

## 2021-12-11 DIAGNOSIS — R5383 Other fatigue: Secondary | ICD-10-CM | POA: Diagnosis not present

## 2021-12-11 DIAGNOSIS — E668 Other obesity: Secondary | ICD-10-CM | POA: Diagnosis not present

## 2022-01-17 DIAGNOSIS — S32010G Wedge compression fracture of first lumbar vertebra, subsequent encounter for fracture with delayed healing: Secondary | ICD-10-CM | POA: Diagnosis not present

## 2022-02-02 ENCOUNTER — Encounter (HOSPITAL_COMMUNITY): Payer: Self-pay

## 2022-02-02 ENCOUNTER — Emergency Department (HOSPITAL_COMMUNITY)
Admission: EM | Admit: 2022-02-02 | Discharge: 2022-02-02 | Disposition: A | Discharge status: Home / Self Care | Payer: BC Managed Care – PPO | Attending: Emergency Medicine | Admitting: Emergency Medicine

## 2022-02-02 ENCOUNTER — Emergency Department (HOSPITAL_COMMUNITY): Payer: BC Managed Care – PPO

## 2022-02-02 DIAGNOSIS — F419 Anxiety disorder, unspecified: Secondary | ICD-10-CM | POA: Diagnosis not present

## 2022-02-02 DIAGNOSIS — D649 Anemia, unspecified: Secondary | ICD-10-CM | POA: Diagnosis not present

## 2022-02-02 DIAGNOSIS — R7989 Other specified abnormal findings of blood chemistry: Secondary | ICD-10-CM | POA: Diagnosis not present

## 2022-02-02 DIAGNOSIS — R0602 Shortness of breath: Secondary | ICD-10-CM | POA: Diagnosis not present

## 2022-02-02 DIAGNOSIS — J069 Acute upper respiratory infection, unspecified: Secondary | ICD-10-CM | POA: Insufficient documentation

## 2022-02-02 LAB — CBC WITH DIFFERENTIAL/PLATELET
Abs Immature Granulocytes: 0.02 10*3/uL (ref 0.00–0.07)
Basophils Absolute: 0.1 10*3/uL (ref 0.0–0.1)
Basophils Relative: 1 %
Eosinophils Absolute: 0.1 10*3/uL (ref 0.0–0.5)
Eosinophils Relative: 2 %
HCT: 33.4 % — ABNORMAL LOW (ref 36.0–46.0)
Hemoglobin: 10.2 g/dL — ABNORMAL LOW (ref 12.0–15.0)
Immature Granulocytes: 0 %
Lymphocytes Relative: 39 %
Lymphs Abs: 2.4 10*3/uL (ref 0.7–4.0)
MCH: 26.1 pg (ref 26.0–34.0)
MCHC: 30.5 g/dL (ref 30.0–36.0)
MCV: 85.4 fL (ref 80.0–100.0)
Monocytes Absolute: 0.5 10*3/uL (ref 0.1–1.0)
Monocytes Relative: 8 %
Neutro Abs: 3 10*3/uL (ref 1.7–7.7)
Neutrophils Relative %: 50 %
Platelets: 405 10*3/uL — ABNORMAL HIGH (ref 150–400)
RBC: 3.91 MIL/uL (ref 3.87–5.11)
RDW: 15.6 % — ABNORMAL HIGH (ref 11.5–15.5)
WBC: 6.1 10*3/uL (ref 4.0–10.5)
nRBC: 0 % (ref 0.0–0.2)

## 2022-02-02 LAB — BASIC METABOLIC PANEL
Anion gap: 5 (ref 5–15)
BUN: 14 mg/dL (ref 6–20)
CO2: 27 mmol/L (ref 22–32)
Calcium: 9 mg/dL (ref 8.9–10.3)
Chloride: 108 mmol/L (ref 98–111)
Creatinine, Ser: 1.11 mg/dL — ABNORMAL HIGH (ref 0.44–1.00)
GFR, Estimated: 59 mL/min — ABNORMAL LOW (ref >60)
Glucose, Bld: 107 mg/dL — ABNORMAL HIGH (ref 70–99)
Potassium: 4 mmol/L (ref 3.5–5.1)
Sodium: 140 mmol/L (ref 135–145)

## 2022-02-02 NOTE — ED Triage Notes (Signed)
Pt arrived via POV, states she was exposed to legionaires disease. Has had scratchy throat, and cough x1 month. Coughing stopped in beginning of march. Endorses SOB and fatigue today.  ?

## 2022-02-02 NOTE — Discharge Instructions (Signed)
You have been provided contact information for an infectious disease specialist's office.  Please call them within the next few days to schedule an appointment for further evaluation. ? ?Return to the ED for new or worsening symptoms as discussed. ?

## 2022-02-02 NOTE — ED Provider Triage Note (Addendum)
Emergency Medicine Provider Triage Evaluation Note ? ?Dymond Gutt , a 56 y.o. female  was evaluated in triage.  Pt complains of recent exposure to legionnaires in the beginning of February.  Exposure followed by 1 to 2 weeks of upper respiratory infection.  Has been contacted by the CDC to fill out paperwork.  Per pt, needs evidence of exposure to legionnaires via antigen testing.  Denies current fever, congestion, cough.  Complains of mild lingering shortness of breath. ? ?Review of Systems  ?Positive: As above ?Negative: As above ? ?Physical Exam  ?There were no vitals taken for this visit. ?Gen:   Awake, no distress, tearful affect ?Resp:  Normal effort, CTAB,  ?MSK:   Moves extremities without difficulty  ?Other:  Speaking rapidly and for long periods without any apparent distress. Tearful.  Expresses SI over one month ago but has had none since then.  Denies current SI/HI. ? ?Medical Decision Making  ?Medically screening exam initiated at 3:20 PM.  Appropriate orders placed.  Evamaria Detore was informed that the remainder of the evaluation will be completed by another provider, this initial triage assessment does not replace that evaluation, and the importance of remaining in the ED until their evaluation is complete. ? ?Labs and imaging ordered ? ?Prince Rome, PA-C ?50/38/88 1534 ? ?  ?Prince Rome, PA-C ?28/00/34 1537 ? ?

## 2022-02-02 NOTE — ED Provider Notes (Signed)
?[facility] DEPT ?Provider Note ? ? ?CSN: [id number] ?Arrival date & time: 02/02/22  1508 ? ?  ? ?History ? ?Chief Complaint  ?Patient presents with  ? Shortness of Breath  ? ? ?Caitlyn Page is a 56 y.o. anxious appearing female presenting today requesting labwork to confirm a legionnaires exposure in mid February.  States she was contacted by the [facility] notifying her of such exposure.  Patient experienced upper respiratory symptoms such as severe cough, shortness of breath, subjective fever a few days after her exposure.  Symptoms lasted 3-7 days and have since resolved with exception of mild shortness of breath.  When sick, pt states she coughed so hard she "broke her back".  Followed up with physical therapy who informed her she only strained it.  Recommended rest and anti-inflammatories.  Pt feels much better and without back pain today.  Tearful on exam.  States her illness felt so bad that she had thought about taking her life for a few days in February.  Denies SI/HI since then or today.  Has been to several facilities requesting labwork without success. ? ?The history is provided by the patient and medical records.  ?Shortness of Breath ? ?  ? ?Home Medications ?Prior to Admission medications   ?Medication Sig Start Date End Date Taking? Authorizing Provider  ?alendronate (FOSAMAX) 70 MG tablet Take 1 tablet (70 mg total) by mouth every 7 (seven) days. 11/20/21   Rosemarie Ax, MD  ?cholecalciferol (VITAMIN D) 25 MCG (1000 UNIT) tablet Take 1 tablet (1,000 Units total) by mouth daily. ?Patient not taking: Reported on 04/27/2021 03/18/20   Orson Slick, MD  ?cyclobenzaprine (FLEXERIL) 10 MG tablet Take 1 tablet by mouth every 8 (eight) hours as needed for muscle spasms. 04/22/21   [provider]  ?ferrous sulfate 325 (65 FE) MG tablet Take 1 tablet (325 mg total) by mouth daily with breakfast. Please take with a source of Vitamin C ?Patient not taking: Reported on  04/27/2021 03/18/20   Orson Slick, MD  ?magnesium 30 MG tablet Take 30 mg by mouth daily.    [provider]  ?Multiple Vitamins-Minerals (MULTIVITAMIN ADULT) CHEW Chew 1 tablet by mouth daily.    [provider]  ?omeprazole (PRILOSEC) 40 MG capsule Take 1 capsule by mouth daily. 02/24/21   [provider]  ?oxyCODONE-acetaminophen (PERCOCET/ROXICET) 5-325 MG tablet Take 1 tablet by mouth every 6 (six) hours as needed for severe pain. 04/27/21   Fredia Sorrow, MD  ?Probiotic Product (PROBIOTIC PO) Take 1 capsule by mouth daily.    [provider]  ?senna-docusate (SENOKOT-S) 8.6-50 MG tablet Take 2 tablets by mouth 2 (two) times daily. Until stooling regularly 04/29/21   Rosemarie Ax, MD  ?thyroid (ARMOUR) 30 MG tablet Take 30 mg by mouth daily before breakfast.    [provider]  ?tiZANidine (ZANAFLEX) 4 MG tablet Take 1 tablet (4 mg total) by mouth every 8 (eight) hours as needed. ?Patient not taking: Reported on 04/27/2021 11/28/20   Jaynee Eagles, PA-C  ?traMADol (ULTRAM) 50 MG tablet Take 50 mg by mouth 2 (two) times daily. 04/22/21   [provider]  ?   ? ?Allergies    ?Egg [eggs or egg-derived products], Fructose, and Gluten meal   ? ?Review of Systems   ?Review of Systems  ?Respiratory:  Positive for shortness of breath.   ? ?Physical Exam ?Updated Vital Signs ?BP 103/83   Pulse 83  Temp 98.3 ?F (36.8 ?C) (Oral)   Resp 20   SpO2 99%  ?Physical Exam ?Vitals and nursing note reviewed.  ?Constitutional:   ?   General: She is not in acute distress. ?   Appearance: She is well-developed. She is not ill-appearing or diaphoretic.  ?HENT:  ?   Head: Normocephalic and atraumatic.  ?   Mouth/Throat:  ?   Mouth: Mucous membranes are moist.  ?   Pharynx: Oropharynx is clear.  ?Eyes:  ?   Conjunctiva/sclera: Conjunctivae normal.  ?Cardiovascular:  ?   Rate and Rhythm: Normal rate and regular rhythm.  ?   Pulses: Normal pulses.  ?   Heart sounds: Normal heart  sounds. No murmur heard. ?Pulmonary:  ?   Effort: Pulmonary effort is normal. No tachypnea, accessory muscle usage or respiratory distress.  ?   Breath sounds: Normal breath sounds. No stridor.  ?Chest:  ?   Chest wall: No mass, deformity, tenderness or crepitus.  ?Abdominal:  ?   Palpations: Abdomen is soft.  ?   Tenderness: There is no abdominal tenderness.  ?Musculoskeletal:     ?   General: No swelling.  ?   Cervical back: Normal range of motion and neck supple.  ?   Right lower leg: No edema.  ?   Left lower leg: No edema.  ?Skin: ?   General: Skin is warm and dry.  ?   Capillary Refill: Capillary refill takes less than 2 seconds.  ?   Coloration: Skin is not cyanotic or pale.  ?   Findings: No rash.  ?Neurological:  ?   Mental Status: She is alert.  ?Psychiatric:     ?   Mood and Affect: Mood is anxious. Affect is tearful.     ?   Speech: Speech is rapid and pressured.     ?   Behavior: Behavior is cooperative.     ?   Thought Content: Thought content is paranoid.  ? ? ?ED Results / Procedures / Treatments   ?Labs ?(all labs ordered are listed, but only abnormal results are displayed) ?Labs Reviewed  ?CBC WITH DIFFERENTIAL/PLATELET - Abnormal; Notable for the following components:  ?    Result Value  ? Hemoglobin 10.2 (*)   ? HCT 33.4 (*)   ? RDW 15.6 (*)   ? Platelets 405 (*)   ? All other components within normal limits  ?BASIC METABOLIC PANEL - Abnormal; Notable for the following components:  ? Glucose, Bld 107 (*)   ? Creatinine, Ser 1.11 (*)   ? GFR, Estimated 59 (*)   ? All other components within normal limits  ? ? ?EKG ?EKG Interpretation ? ?Date/Time:  Monday February 02 2022 15:21:48 EDT ?Ventricular Rate:  89 ?PR Interval:  160 ?QRS Duration: 87 ?QT Interval:  362 ?QTC Calculation: 441 ?R Axis:   77 ?Text Interpretation: Sinus rhythm Ventricular premature complex Aberrant conduction of SV complex(es) Low voltage, precordial leads Borderline T abnormalities, anterior leads No old tracing to compare  Confirmed by Nanda Quinton [phone]) on 02/02/2022 3:27:22 PM ? ?Radiology ?DG Chest 2 View ? ?Result Date: 02/02/2022 ?CLINICAL DATA:  Shortness of breath EXAM: CHEST - 2 VIEW COMPARISON:  Chest x-ray dated November 28, 2020; thoracic spine x-ray dated August 25, 2021 FINDINGS: Cardiac and mediastinal contours are within normal limits. Large hiatal hernia. Lungs are clear. No pleural effusion or pneumothorax. Unchanged compression deformities T12 and L1. IMPRESSION: 1. No active cardiopulmonary disease. 2. Large hiatal hernia. Electronically  Signed   By: Yetta Glassman M.D.   On: 02/02/2022 16:11   ? ?Procedures ?Procedures  ? ? ?Medications Ordered in ED ?Medications - No data to display ? ?ED Course/ Medical Decision Making/ A&P ?  ?                        ?Medical Decision Making ?Amount and/or Complexity of Data Reviewed ?External Data Reviewed: notes. ?Labs: ordered. Decision-making details documented in ED Course. ?Radiology: ordered and independent interpretation performed. Decision-making details documented in ED Course. ? ? ?56 y.o. female presents to the ED for concern of Shortness of Breath ? Marland Kitchen  This involves an extensive number of treatment options, and is a complaint that carries with it a high risk of complications and morbidity.  Comorbidities that complicate the patient evaluation include Anxiety.  Additional history obtained from internal/external records available via epic ? ?Interpretation: ?I ordered, and personally interpreted labs.  The pertinent results include:   ?CBC - mild anemia similar to previous labs within the last year ?BMP - mild elevated creatinine similar to previous labs within the last year ? ?I ordered imaging studies including CXR .  I independently visualized and interpreted imaging which showed no acute cardiopulmonary pathology.  I agree with the radiologist interpretation ? ?Intervention: ?Test Considered: None ?  ?Critical Interventions: None ?  ?Consultations Obtained:  None ? ?ED Course: ?Pt anxious appearing with mild paranoia.  Physical exam findings unremarkable.  No concern for pneumonia, pneumothorax, upper respiratory infection, sepsis.  Pt has no complaints other than mild lin

## 2022-02-09 ENCOUNTER — Ambulatory Visit (INDEPENDENT_AMBULATORY_CARE_PROVIDER_SITE_OTHER): Payer: BC Managed Care – PPO | Admitting: Infectious Disease

## 2022-02-09 ENCOUNTER — Encounter: Payer: Self-pay | Admitting: Infectious Disease

## 2022-02-09 ENCOUNTER — Other Ambulatory Visit: Payer: Self-pay

## 2022-02-09 ENCOUNTER — Other Ambulatory Visit: Payer: Self-pay | Admitting: Infectious Disease

## 2022-02-09 VITALS — BP 107/76 | HR 104 | Temp 98.3°F | Ht 64.0 in | Wt 193.0 lb

## 2022-02-09 DIAGNOSIS — E6609 Other obesity due to excess calories: Secondary | ICD-10-CM | POA: Diagnosis not present

## 2022-02-09 DIAGNOSIS — A481 Legionnaires' disease: Secondary | ICD-10-CM

## 2022-02-09 DIAGNOSIS — R1013 Epigastric pain: Secondary | ICD-10-CM

## 2022-02-09 DIAGNOSIS — S22080S Wedge compression fracture of T11-T12 vertebra, sequela: Secondary | ICD-10-CM | POA: Diagnosis not present

## 2022-02-09 DIAGNOSIS — S32010G Wedge compression fracture of first lumbar vertebra, subsequent encounter for fracture with delayed healing: Secondary | ICD-10-CM | POA: Diagnosis not present

## 2022-02-09 DIAGNOSIS — S24159S Other incomplete lesion at unspecified level of thoracic spinal cord, sequela: Secondary | ICD-10-CM | POA: Diagnosis not present

## 2022-02-09 DIAGNOSIS — M810 Age-related osteoporosis without current pathological fracture: Secondary | ICD-10-CM | POA: Diagnosis not present

## 2022-02-09 DIAGNOSIS — E669 Obesity, unspecified: Secondary | ICD-10-CM | POA: Diagnosis not present

## 2022-02-09 DIAGNOSIS — E039 Hypothyroidism, unspecified: Secondary | ICD-10-CM | POA: Diagnosis not present

## 2022-02-09 DIAGNOSIS — E785 Hyperlipidemia, unspecified: Secondary | ICD-10-CM | POA: Diagnosis not present

## 2022-02-09 HISTORY — DX: Legionnaires' disease: A48.1

## 2022-02-09 NOTE — Addendum Note (Signed)
Addended by: Lucie Leather D on: 02/09/2022 09:48 AM ? ? Modules accepted: Orders ? ?

## 2022-02-09 NOTE — Progress Notes (Signed)
? ?Subjective:  ?Reason for infectious disease consult: Patient with history of suspected Legionella infection with exposure to water supply at a [facility] where there was an outbreak ? ?[name] physician: Nanda Quinton ? Patient ID: Caitlyn Page, female    DOB: 1965/12/09, 56 y.o.   MRN: [id number] ? ?HPI ? ?Caitlyn Page is a 56 year old Caucasian lady with hx of anxiety, CKD, GERD who after staying at a hotel in  [place] in January when she developed  a severe respiratory tract infection. She had progressive cough feeling dyspnea with a cough and become quite severe.  She had multiple COVID test that were negative.  She is currently covered through a COBRA for her heatlh insurance which made her reluctant to seek care. ? ?However she did have an appointment with a PCP in the middle of all of this unfortunately her friend was a pharmacist recommended that she make sure she leaves with azithromycin.  She indeed did get azithromycin and did have dramatic improvement in her symptoms. ? ?Unfortunately her coughing was still fairly violent at that time and she sustained a fracture during one of her coughing fits. ? ?Coughing fevers malaise resolved though she still has some residual dyspnea on exertion. ? ?She separately received received a letter from the hotel in [place] in March which I am showing below regarding an outbreak that occurred in their hotel ? ? ? ? ? ? ? ?Past Medical History:  ?Diagnosis Date  ? Allergy   ? Anxiety   ? Arthritis   ? Chronic kidney disease   ? Depression   ? Frequent headaches   ? GERD (gastroesophageal reflux disease)   ? Iron deficiency anemia 12/19/2019  ? Legionella pneumonia ([place]) 02/09/2022  ? Prediabetes   ? Right foot pain   ? Sinus problem   ? Thyroid disease   ? Vision problems   ? ? ?Past Surgical History:  ?Procedure Laterality Date  ? sinus surgery   2000  ? TONSILLECTOMY AND ADENOIDECTOMY    ? Sometime in the 1980's  ? ? ?Family History  ?Problem Relation Age of Onset  ?  Breast cancer Mother   ? Aneurysm Mother   ? Heart Problems Father   ? Headache Sister   ? Other Sister   ?     vertigo  ? Colon cancer Neg Hx   ? Esophageal cancer Neg Hx   ? Stomach cancer Neg Hx   ? Rectal cancer Neg Hx   ? ? ?  ?Social History  ? ?Socioeconomic History  ? Marital status: Married  ?  Spouse name: Not on file  ? Number of children: 0  ? Years of education: Not on file  ? Highest education level: Not on file  ?Occupational History  ? Not on file  ?Tobacco Use  ? Smoking status: Never  ? Smokeless tobacco: Never  ?Vaping Use  ? Vaping Use: Never used  ?Substance and Sexual Activity  ? Alcohol use: No  ? Drug use: No  ? Sexual activity: Not on file  ?Other Topics Concern  ? Not on file  ?Social History Narrative  ? Lives at home alone   ? Right handed  ? Caffeine: a few times per week  ? ?Social Determinants of Health  ? ?Financial Resource Strain: Not on file  ?Food Insecurity: Not on file  ?Transportation Needs: Not on file  ?Physical Activity: Not on file  ?Stress: Not on file  ?Social Connections: Not  on file  ? ? ?Allergies  ?Allergen Reactions  ? Egg [Eggs Or Egg-Derived Products] Nausea And Vomiting  ? Fructose   ?  "probably"  ? Gluten Meal   ?  "maybe"  ? ? ? ?Current Outpatient Medications:  ?  cholecalciferol (VITAMIN D) 25 MCG (1000 UNIT) tablet, Take 1 tablet (1,000 Units total) by mouth daily., Disp: 90 tablet, Rfl: 3 ?  methocarbamol (ROBAXIN) 500 MG tablet, Take 500 mg by mouth daily as needed., Disp: , Rfl:  ?  Multiple Vitamins-Minerals (MULTIVITAMIN ADULT) CHEW, Chew 1 tablet by mouth daily., Disp: , Rfl:  ?  omeprazole (PRILOSEC) 40 MG capsule, Take by mouth., Disp: , Rfl:  ?  Probiotic Product (PROBIOTIC PO), Take 1 capsule by mouth daily., Disp: , Rfl:  ?  thyroid (ARMOUR) 30 MG tablet, Take 30 mg by mouth daily before breakfast., Disp: , Rfl:  ?  alendronate (FOSAMAX) 70 MG tablet, Take 1 tablet (70 mg total) by mouth every 7 (seven) days. (Patient not taking: Reported on  02/09/2022), Disp: 4 tablet, Rfl: 11 ?  cyclobenzaprine (FLEXERIL) 10 MG tablet, Take 1 tablet by mouth every 8 (eight) hours as needed for muscle spasms., Disp: , Rfl:  ?  ferrous sulfate 325 (65 FE) MG tablet, Take 1 tablet (325 mg total) by mouth daily with breakfast. Please take with a source of Vitamin C (Patient not taking: Reported on 04/27/2021), Disp: 90 tablet, Rfl: 3 ?  magnesium 30 MG tablet, Take 30 mg by mouth daily., Disp: , Rfl:  ?  omeprazole (PRILOSEC) 40 MG capsule, Take 1 capsule by mouth daily., Disp: , Rfl:  ?  oxyCODONE-acetaminophen (PERCOCET/ROXICET) 5-325 MG tablet, Take 1 tablet by mouth every 6 (six) hours as needed for severe pain., Disp: 15 tablet, Rfl: 0 ?  senna-docusate (SENOKOT-S) 8.6-50 MG tablet, Take 2 tablets by mouth 2 (two) times daily. Until stooling regularly, Disp: 60 tablet, Rfl: 0 ?  tiZANidine (ZANAFLEX) 4 MG tablet, Take 1 tablet (4 mg total) by mouth every 8 (eight) hours as needed. (Patient not taking: Reported on 04/27/2021), Disp: 30 tablet, Rfl: 0 ?  traMADol (ULTRAM) 50 MG tablet, Take 50 mg by mouth 2 (two) times daily., Disp: , Rfl:  ? ?Current Facility-Administered Medications:  ?  0.9 %  sodium chloride infusion, 500 mL, Intravenous, Once, Thornton Park, MD ? ? ? ?Review of Systems  ?Constitutional:  Negative for activity change, appetite change, chills, diaphoresis, fatigue, fever and unexpected weight change.  ?HENT:  Negative for congestion, rhinorrhea, sinus pressure, sneezing, sore throat and trouble swallowing.   ?Eyes:  Negative for photophobia and visual disturbance.  ?Respiratory:  Positive for shortness of breath. Negative for cough, chest tightness, wheezing and stridor.   ?Cardiovascular:  Negative for chest pain, palpitations and leg swelling.  ?Gastrointestinal:  Negative for abdominal distention, abdominal pain, anal bleeding, blood in stool, constipation, diarrhea, nausea and vomiting.  ?Genitourinary:  Negative for difficulty urinating,  dysuria, flank pain and hematuria.  ?Musculoskeletal:  Negative for arthralgias, back pain, gait problem, joint swelling and myalgias.  ?Skin:  Negative for color change, pallor, rash and wound.  ?Neurological:  Negative for dizziness, tremors, weakness and light-headedness.  ?Hematological:  Negative for adenopathy. Does not bruise/bleed easily.  ?Psychiatric/Behavioral:  Negative for agitation, behavioral problems, confusion, decreased concentration, dysphoric mood and sleep disturbance.   ? ?   ?Objective:  ? Physical Exam ?Constitutional:   ?   General: She is not in acute distress. ?   Appearance:  Normal appearance. She is well-developed. She is not ill-appearing or diaphoretic.  ?HENT:  ?   Head: Normocephalic and atraumatic.  ?   Right Ear: Hearing and external ear normal.  ?   Left Ear: Hearing and external ear normal.  ?   Nose: No nasal deformity or rhinorrhea.  ?Eyes:  ?   General: No scleral icterus. ?   Conjunctiva/sclera: Conjunctivae normal.  ?   Right eye: Right conjunctiva is not injected.  ?   Left eye: Left conjunctiva is not injected.  ?   Pupils: Pupils are equal, round, and reactive to light.  ?Neck:  ?   Vascular: No JVD.  ?Cardiovascular:  ?   Rate and Rhythm: Normal rate and regular rhythm.  ?   Heart sounds: Normal heart sounds, S1 normal and S2 normal. No murmur heard. ?  No friction rub. No gallop.  ?Pulmonary:  ?   Effort: Pulmonary effort is normal. No respiratory distress.  ?   Breath sounds: Normal breath sounds. No stridor. No wheezing, rhonchi or rales.  ?Abdominal:  ?   General: Bowel sounds are normal. There is no distension.  ?   Palpations: Abdomen is soft.  ?   Tenderness: There is no abdominal tenderness.  ?Musculoskeletal:     ?   General: Normal range of motion.  ?   Right shoulder: Normal.  ?   Left shoulder: Normal.  ?   Cervical back: Normal range of motion and neck supple.  ?   Right hip: Normal.  ?   Left hip: Normal.  ?   Right knee: Normal.  ?   Left knee: Normal.   ?Lymphadenopathy:  ?   Head:  ?   Right side of head: No submandibular, preauricular or posterior auricular adenopathy.  ?   Left side of head: No submandibular, preauricular or posterior auricular adenopathy.  ?   Ce

## 2022-02-09 NOTE — Addendum Note (Signed)
Addended by: Lucie Leather D on: 02/09/2022 09:56 AM ? ? Modules accepted: Orders ? ?

## 2022-02-09 NOTE — Addendum Note (Signed)
Addended by: Lucie Leather D on: 02/09/2022 09:49 AM ? ? Modules accepted: Orders ? ?

## 2022-02-09 NOTE — Addendum Note (Signed)
Addended by: Lucie Leather D on: 02/09/2022 10:31 AM ? ? Modules accepted: Orders ? ?

## 2022-02-12 LAB — LEGIONELLA ANTIGEN, URINE: Legionella Antigen, Urine: NOT DETECTED

## 2022-02-13 LAB — LEGIONELLA PNEUMOPHILA ANTIBODY (IGG), IFA: LEGIONELLA PNEUMOPHILA AB (IGG), IFA: <1:64 {titer}

## 2022-02-13 LAB — LEGIONELLA PNEUMOPHILA ANTIBODY (IGM), IFA: LEGIONELLA PNEUMOPHILA AB (IGM), IFA: <1:256 {titer}

## 2022-02-23 DIAGNOSIS — M810 Age-related osteoporosis without current pathological fracture: Secondary | ICD-10-CM | POA: Diagnosis not present

## 2022-02-23 DIAGNOSIS — Z01419 Encounter for gynecological examination (general) (routine) without abnormal findings: Secondary | ICD-10-CM | POA: Diagnosis not present

## 2022-02-23 DIAGNOSIS — E669 Obesity, unspecified: Secondary | ICD-10-CM | POA: Diagnosis not present

## 2022-02-23 DIAGNOSIS — S24159S Other incomplete lesion at unspecified level of thoracic spinal cord, sequela: Secondary | ICD-10-CM | POA: Diagnosis not present

## 2022-03-18 DIAGNOSIS — E559 Vitamin D deficiency, unspecified: Secondary | ICD-10-CM | POA: Diagnosis not present

## 2022-03-18 DIAGNOSIS — E785 Hyperlipidemia, unspecified: Secondary | ICD-10-CM | POA: Diagnosis not present

## 2022-03-18 DIAGNOSIS — D509 Iron deficiency anemia, unspecified: Secondary | ICD-10-CM | POA: Diagnosis not present

## 2022-03-18 DIAGNOSIS — E538 Deficiency of other specified B group vitamins: Secondary | ICD-10-CM | POA: Diagnosis not present

## 2022-03-18 DIAGNOSIS — N951 Menopausal and female climacteric states: Secondary | ICD-10-CM | POA: Diagnosis not present

## 2022-03-18 DIAGNOSIS — R5383 Other fatigue: Secondary | ICD-10-CM | POA: Diagnosis not present

## 2022-03-18 DIAGNOSIS — Z6833 Body mass index (BMI) 33.0-33.9, adult: Secondary | ICD-10-CM | POA: Diagnosis not present

## 2022-03-18 DIAGNOSIS — E039 Hypothyroidism, unspecified: Secondary | ICD-10-CM | POA: Diagnosis not present

## 2022-03-18 DIAGNOSIS — R635 Abnormal weight gain: Secondary | ICD-10-CM | POA: Diagnosis not present

## 2022-03-24 DIAGNOSIS — Z1331 Encounter for screening for depression: Secondary | ICD-10-CM | POA: Diagnosis not present

## 2022-03-24 DIAGNOSIS — Z6833 Body mass index (BMI) 33.0-33.9, adult: Secondary | ICD-10-CM | POA: Diagnosis not present

## 2022-03-24 DIAGNOSIS — R232 Flushing: Secondary | ICD-10-CM | POA: Diagnosis not present

## 2022-03-24 DIAGNOSIS — R635 Abnormal weight gain: Secondary | ICD-10-CM | POA: Diagnosis not present

## 2022-03-24 DIAGNOSIS — N951 Menopausal and female climacteric states: Secondary | ICD-10-CM | POA: Diagnosis not present

## 2022-03-24 DIAGNOSIS — Z1339 Encounter for screening examination for other mental health and behavioral disorders: Secondary | ICD-10-CM | POA: Diagnosis not present

## 2022-04-07 DIAGNOSIS — Z6834 Body mass index (BMI) 34.0-34.9, adult: Secondary | ICD-10-CM | POA: Diagnosis not present

## 2022-04-07 DIAGNOSIS — E785 Hyperlipidemia, unspecified: Secondary | ICD-10-CM | POA: Diagnosis not present

## 2022-05-05 DIAGNOSIS — E039 Hypothyroidism, unspecified: Secondary | ICD-10-CM | POA: Diagnosis not present

## 2022-05-11 DIAGNOSIS — N951 Menopausal and female climacteric states: Secondary | ICD-10-CM | POA: Diagnosis not present

## 2022-05-11 DIAGNOSIS — Z6834 Body mass index (BMI) 34.0-34.9, adult: Secondary | ICD-10-CM | POA: Diagnosis not present

## 2022-05-11 DIAGNOSIS — R232 Flushing: Secondary | ICD-10-CM | POA: Diagnosis not present

## 2022-05-11 DIAGNOSIS — N898 Other specified noninflammatory disorders of vagina: Secondary | ICD-10-CM | POA: Diagnosis not present

## 2022-05-13 DIAGNOSIS — M21611 Bunion of right foot: Secondary | ICD-10-CM | POA: Diagnosis not present

## 2022-05-13 DIAGNOSIS — M25571 Pain in right ankle and joints of right foot: Secondary | ICD-10-CM | POA: Diagnosis not present

## 2022-05-13 DIAGNOSIS — M79671 Pain in right foot: Secondary | ICD-10-CM | POA: Diagnosis not present

## 2022-05-13 DIAGNOSIS — M7751 Other enthesopathy of right foot: Secondary | ICD-10-CM | POA: Diagnosis not present

## 2022-05-26 DIAGNOSIS — R5383 Other fatigue: Secondary | ICD-10-CM | POA: Diagnosis not present

## 2022-05-27 DIAGNOSIS — M7751 Other enthesopathy of right foot: Secondary | ICD-10-CM | POA: Diagnosis not present

## 2022-05-27 DIAGNOSIS — M722 Plantar fascial fibromatosis: Secondary | ICD-10-CM | POA: Diagnosis not present

## 2022-06-01 DIAGNOSIS — M25572 Pain in left ankle and joints of left foot: Secondary | ICD-10-CM | POA: Diagnosis not present

## 2022-06-01 DIAGNOSIS — M12272 Villonodular synovitis (pigmented), left ankle and foot: Secondary | ICD-10-CM | POA: Diagnosis not present

## 2022-06-01 DIAGNOSIS — M7751 Other enthesopathy of right foot: Secondary | ICD-10-CM | POA: Diagnosis not present

## 2022-06-02 DIAGNOSIS — R5383 Other fatigue: Secondary | ICD-10-CM | POA: Diagnosis not present

## 2022-06-09 DIAGNOSIS — R5383 Other fatigue: Secondary | ICD-10-CM | POA: Diagnosis not present

## 2022-06-24 DIAGNOSIS — R5383 Other fatigue: Secondary | ICD-10-CM | POA: Diagnosis not present

## 2022-07-19 IMAGING — MR MR LUMBAR SPINE W/O CM
4 of 5 series · 30 of 48 positions shown · non-contrast
Comparison: Lumbar radiograph from 2 days ago

CLINICAL DATA: Low back pain with cauda equina suspected. Injury
lifting boxes 04/15/2021

EXAM:
MRI LUMBAR SPINE WITHOUT CONTRAST
TECHNIQUE: Multiplanar, multisequence MR imaging of the lumbar spine was
performed. No intravenous contrast was administered.

[Series 5: T1 · sagittal · 4.0mm · 0.88mm/px · 5 of 17 slices shown (1 of 2)]
[im 1/17]
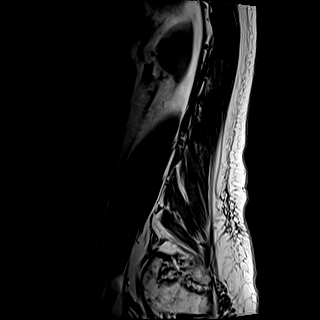
[im 5/17]
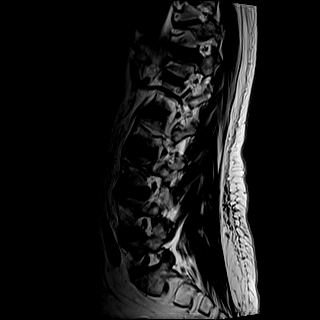
[im 9/17]
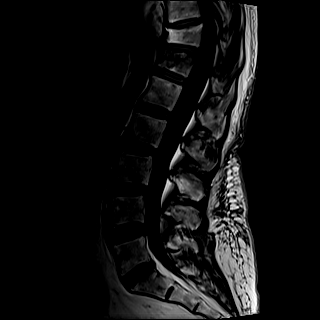
[im 13/17]
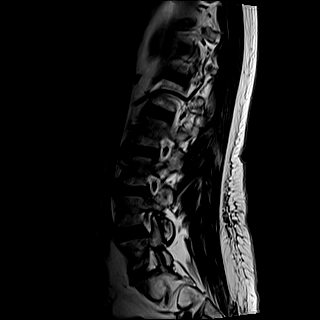
[im 17/17]
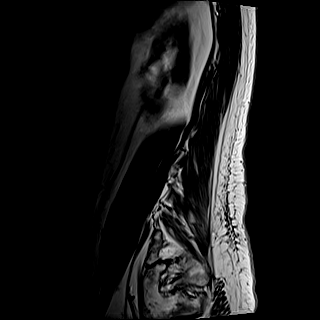

[Series 6: T2 · sagittal · 4.0mm · 0.88mm/px · 5 of 17 slices shown (1 of 2)]
[im 1/17]
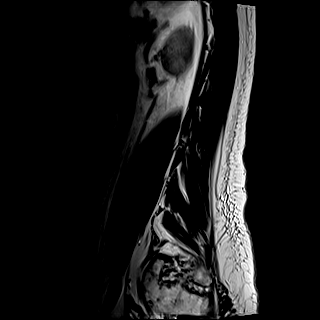
[im 5/17]
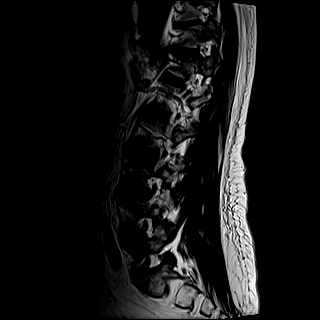
[im 9/17]
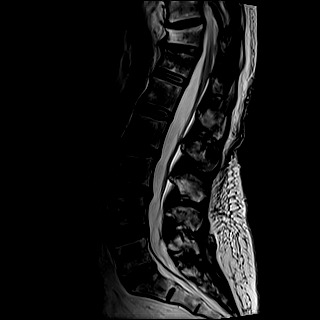
[im 13/17]
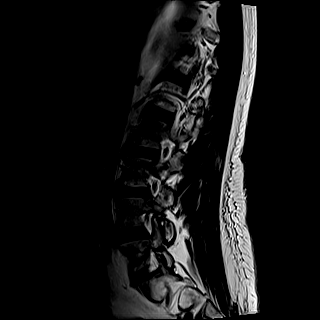
[im 17/17]
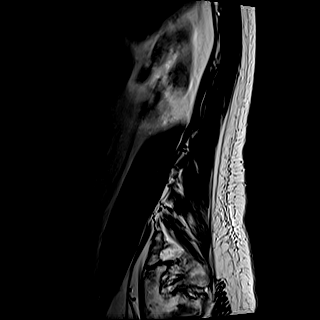

[Series 8: T2 · axial · 4.0mm · 0.62mm/px · z∈[-80,+146]mm · 10 of 47 slices shown (2 of 2)]
[im 4/47]
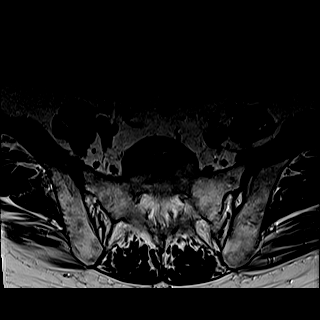
[im 7/47]
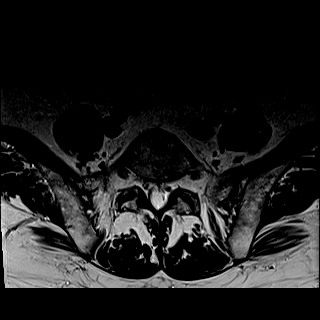
[im 10/47]
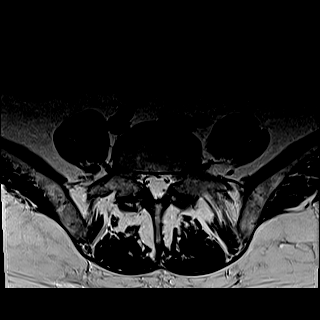
[im 16/47]
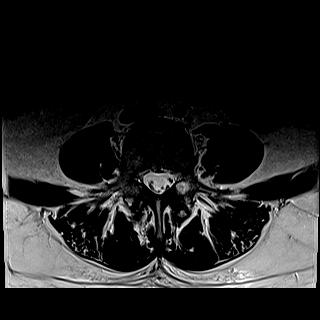
[im 22/47]
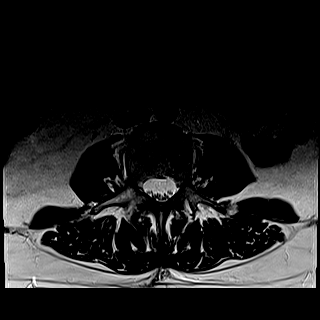
[im 25/47]
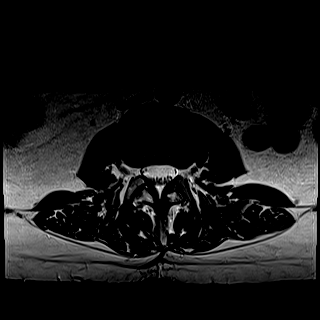
[im 28/47]
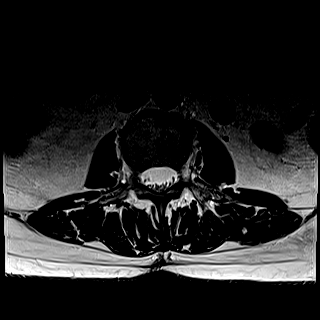
[im 34/47]
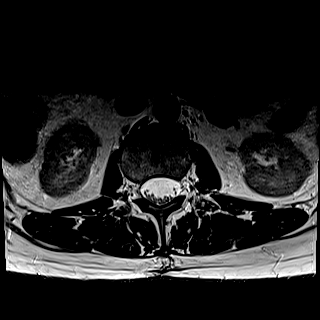
[im 40/47]
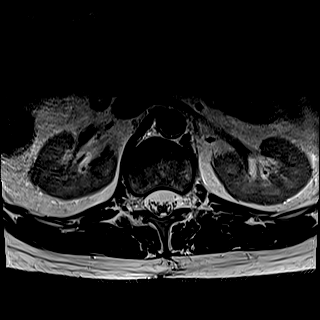
[im 47/47]
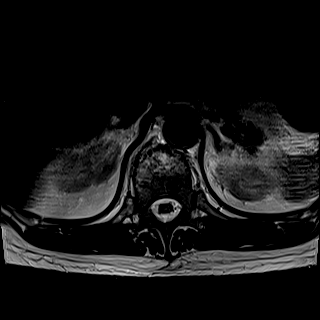

[Series 9: T1 · axial · 4.0mm · 0.39mm/px · z∈[-80,+146]mm · 10 of 47 slices shown (2 of 2)]
[im 4/47]
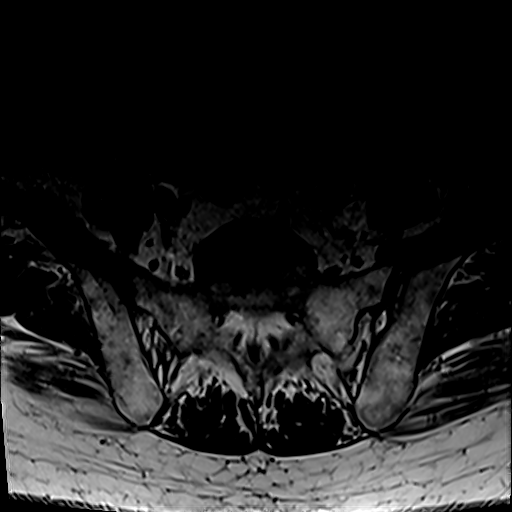
[im 7/47]
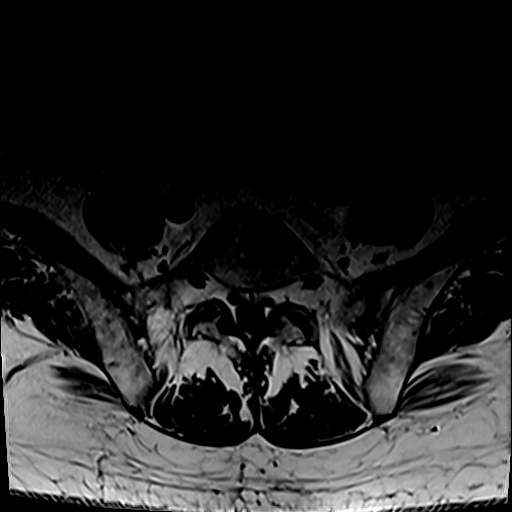
[im 10/47]
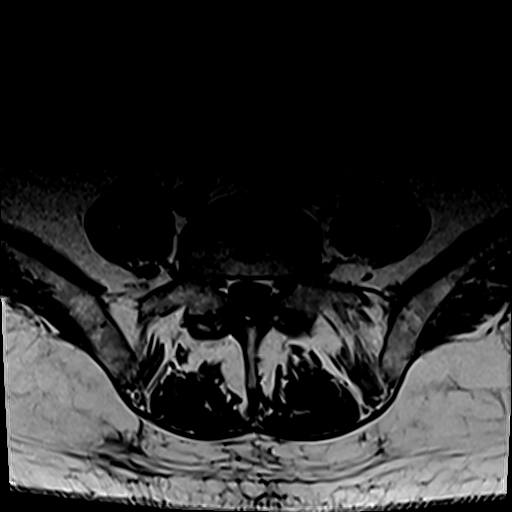
[im 16/47]
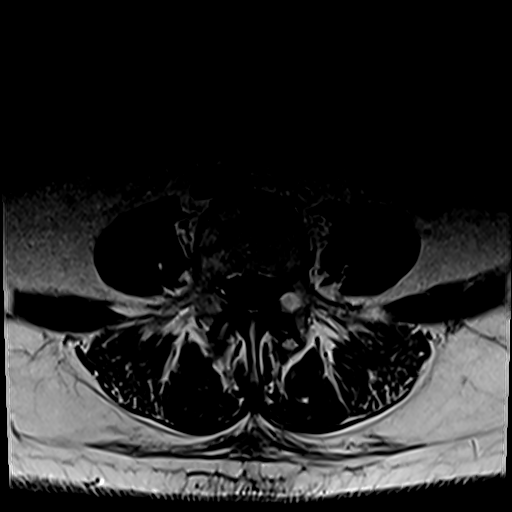
[im 22/47]
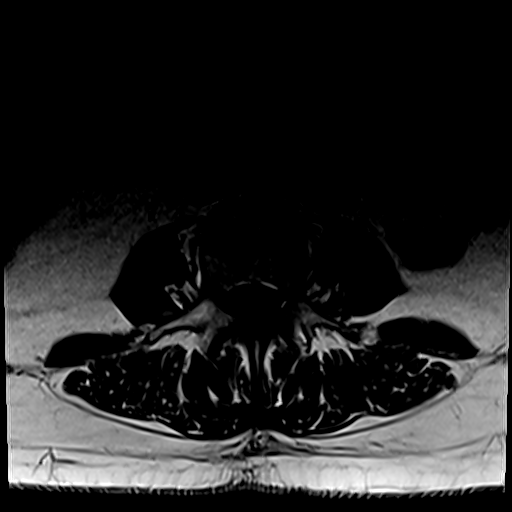
[im 25/47]
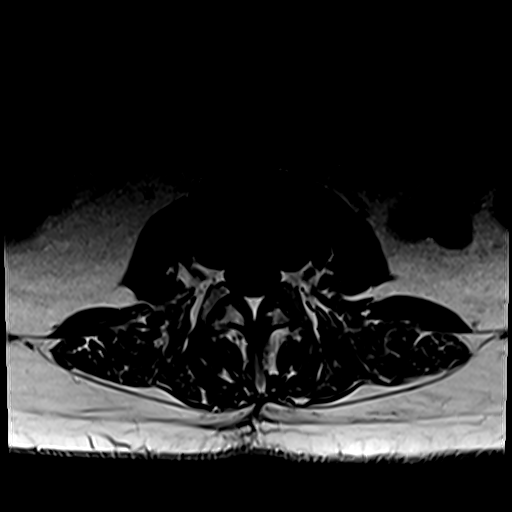
[im 28/47]
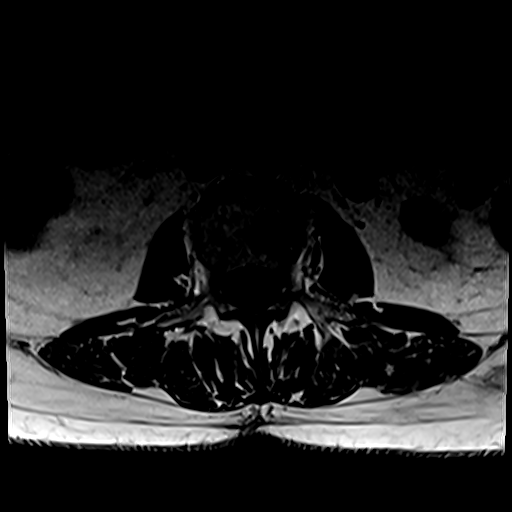
[im 34/47]
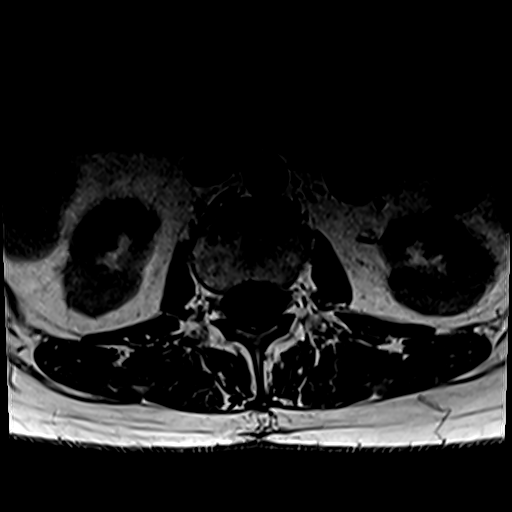
[im 40/47]
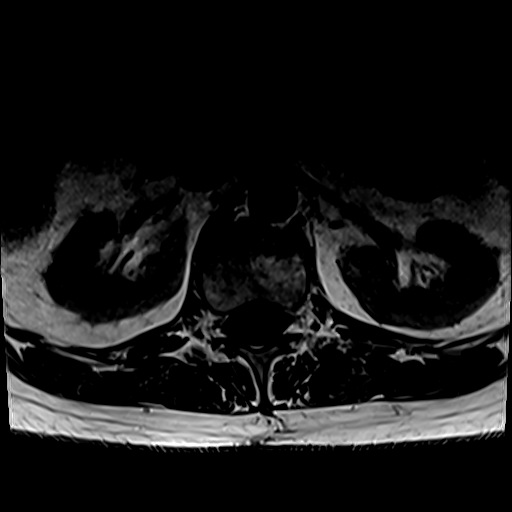
[im 47/47]
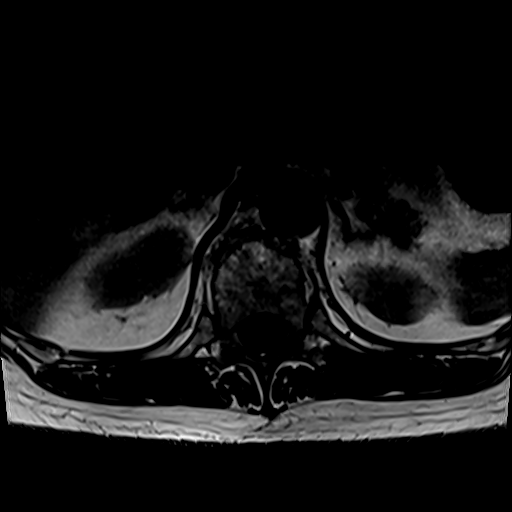

[30 of 48 positions shown; findings below may reference images not displayed]

FINDINGS: Segmentation:  Transitional S1 vertebra when numbered from above.

Alignment:  Negative for listhesis.

Vertebrae: Marrow edema in the minimally depressed L1 vertebral body
with horizontal fracture plane following the superior endplate. No
retropulsion. Pre-existing T12 compression fracture with completed
healing since prior. No evidence of bone lesion or discitis.

Conus medullaris and cauda equina: Conus extends to the L1-2 level.
Conus and cauda equina appear normal.

Paraspinal and other soft tissues: Mild posterior scarring over the
upper lumbar spine.

Disc levels:

T12- L1: Ventral endplate spurring

L1-L2: Unremarkable.

L2-L3: Unremarkable.

L3-L4: Mild disc narrowing and bulging

L4-L5: Disc narrowing and bulging with left paracentral to foraminal
protrusion that is noncompressive. Mild facet spurring.

L5-S1:Mild facet spurring.
IMPRESSION: 1. Transitional S1 vertebra when numbered from above.
2. L1 acute compression fracture with mild superior endplate
depression.
3. Remote and healed T12 compression fracture.
4. Noncompressive degenerative changes as described.

## 2022-08-19 DIAGNOSIS — R790 Abnormal level of blood mineral: Secondary | ICD-10-CM | POA: Diagnosis not present

## 2022-08-19 DIAGNOSIS — E039 Hypothyroidism, unspecified: Secondary | ICD-10-CM | POA: Diagnosis not present

## 2022-08-19 DIAGNOSIS — Z0000 Encounter for general adult medical examination without abnormal findings: Secondary | ICD-10-CM | POA: Diagnosis not present

## 2022-08-19 DIAGNOSIS — K219 Gastro-esophageal reflux disease without esophagitis: Secondary | ICD-10-CM | POA: Diagnosis not present

## 2022-08-19 DIAGNOSIS — D508 Other iron deficiency anemias: Secondary | ICD-10-CM | POA: Diagnosis not present

## 2022-08-19 DIAGNOSIS — Z13220 Encounter for screening for lipoid disorders: Secondary | ICD-10-CM | POA: Diagnosis not present

## 2022-08-19 DIAGNOSIS — E538 Deficiency of other specified B group vitamins: Secondary | ICD-10-CM | POA: Diagnosis not present

## 2022-08-25 ENCOUNTER — Telehealth: Payer: Self-pay | Admitting: Hematology and Oncology

## 2022-08-25 NOTE — Telephone Encounter (Signed)
Scheduled appt per 10/10 referral. Pt already established with Dr. Lorenso Courier. Pt is aware of appt date/time.

## 2022-09-02 ENCOUNTER — Inpatient Hospital Stay: Payer: BC Managed Care – PPO | Attending: Hematology and Oncology | Admitting: Hematology and Oncology

## 2022-09-02 ENCOUNTER — Other Ambulatory Visit: Payer: Self-pay

## 2022-09-02 ENCOUNTER — Other Ambulatory Visit: Payer: Self-pay | Admitting: Hematology and Oncology

## 2022-09-02 ENCOUNTER — Inpatient Hospital Stay: Payer: BC Managed Care – PPO

## 2022-09-02 VITALS — BP 124/85 | HR 86 | Temp 98.5°F | Resp 15 | Wt 193.1 lb

## 2022-09-02 DIAGNOSIS — E079 Disorder of thyroid, unspecified: Secondary | ICD-10-CM | POA: Diagnosis not present

## 2022-09-02 DIAGNOSIS — D509 Iron deficiency anemia, unspecified: Secondary | ICD-10-CM | POA: Insufficient documentation

## 2022-09-02 DIAGNOSIS — N189 Chronic kidney disease, unspecified: Secondary | ICD-10-CM | POA: Insufficient documentation

## 2022-09-02 DIAGNOSIS — D500 Iron deficiency anemia secondary to blood loss (chronic): Secondary | ICD-10-CM

## 2022-09-02 DIAGNOSIS — K219 Gastro-esophageal reflux disease without esophagitis: Secondary | ICD-10-CM | POA: Diagnosis not present

## 2022-09-02 LAB — CMP (CANCER CENTER ONLY)
ALT: 12 U/L (ref 0–44)
AST: 18 U/L (ref 15–41)
Albumin: 3.9 g/dL (ref 3.5–5.0)
Alkaline Phosphatase: 84 U/L (ref 38–126)
Anion gap: 7 (ref 5–15)
BUN: 12 mg/dL (ref 6–20)
CO2: 26 mmol/L (ref 22–32)
Calcium: 9.1 mg/dL (ref 8.9–10.3)
Chloride: 107 mmol/L (ref 98–111)
Creatinine: 1.07 mg/dL — ABNORMAL HIGH (ref 0.44–1.00)
GFR, Estimated: >60 mL/min (ref >60)
Glucose, Bld: 95 mg/dL (ref 70–99)
Potassium: 3.9 mmol/L (ref 3.5–5.1)
Sodium: 140 mmol/L (ref 135–145)
Total Bilirubin: 0.3 mg/dL (ref 0.3–1.2)
Total Protein: 6.9 g/dL (ref 6.5–8.1)

## 2022-09-02 LAB — CBC WITH DIFFERENTIAL (CANCER CENTER ONLY)
Abs Immature Granulocytes: 0.01 10*3/uL (ref 0.00–0.07)
Basophils Absolute: 0 10*3/uL (ref 0.0–0.1)
Basophils Relative: 1 %
Eosinophils Absolute: 0.2 10*3/uL (ref 0.0–0.5)
Eosinophils Relative: 4 %
HCT: 28.9 % — ABNORMAL LOW (ref 36.0–46.0)
Hemoglobin: 8.8 g/dL — ABNORMAL LOW (ref 12.0–15.0)
Immature Granulocytes: 0 %
Lymphocytes Relative: 43 %
Lymphs Abs: 2.3 10*3/uL (ref 0.7–4.0)
MCH: 22.9 pg — ABNORMAL LOW (ref 26.0–34.0)
MCHC: 30.4 g/dL (ref 30.0–36.0)
MCV: 75.1 fL — ABNORMAL LOW (ref 80.0–100.0)
Monocytes Absolute: 0.4 10*3/uL (ref 0.1–1.0)
Monocytes Relative: 8 %
Neutro Abs: 2.4 10*3/uL (ref 1.7–7.7)
Neutrophils Relative %: 44 %
Platelet Count: 392 10*3/uL (ref 150–400)
RBC: 3.85 MIL/uL — ABNORMAL LOW (ref 3.87–5.11)
RDW: 20.2 % — ABNORMAL HIGH (ref 11.5–15.5)
WBC Count: 5.4 10*3/uL (ref 4.0–10.5)
nRBC: 0 % (ref 0.0–0.2)

## 2022-09-02 LAB — IRON AND IRON BINDING CAPACITY (CC-WL,HP ONLY)
Iron: 21 ug/dL — ABNORMAL LOW (ref 28–170)
Saturation Ratios: 5 % — ABNORMAL LOW (ref 10.4–31.8)
TIBC: 466 ug/dL — ABNORMAL HIGH (ref 250–450)
UIBC: 445 ug/dL — ABNORMAL HIGH (ref 148–442)

## 2022-09-02 LAB — FERRITIN: Ferritin: 7 ng/mL — ABNORMAL LOW (ref 11–307)

## 2022-09-02 LAB — RETIC PANEL
Immature Retic Fract: 28.6 % — ABNORMAL HIGH (ref 2.3–15.9)
RBC.: 3.8 MIL/uL — ABNORMAL LOW (ref 3.87–5.11)
Retic Count, Absolute: 52.4 10*3/uL (ref 19.0–186.0)
Retic Ct Pct: 1.4 % (ref 0.4–3.1)
Reticulocyte Hemoglobin: 21.6 pg — ABNORMAL LOW (ref >27.9)

## 2022-09-02 NOTE — Progress Notes (Signed)
[place] Telephone:([phone]) [phone]   [phone]  PROGRESS NOTE  Patient Care Team: System, Provider Not In as PCP - General  Hematological/Oncological History # Iron Deficiency Anemia, Unclear Etiology  1)  12/16/2018: WBC 4.8, Hgb 12.6, Plt 334, MCV 92.1.  2)  10/24/2019: WBC 6.3, Hgb 9.2, MCV 84, Plt 481, RDW 17.2%, Cr. 1.2 3) 12/18/2019: Establish care with Dr. Lorenso Courier. WBC 4.4, Hgb 8.9, Plt 366, MCV 78.4. Iron 12, Sat 3%, Ferritin 6, TIBC 431 4) 03/18/2020: WBC 5.4, Hgb 12.9, MCV 92.6, Plt 292. Iron 49, TIBC 371, ferritin 29, Sat 13% 5) 06/19/2020: WBC 5.8, Hgb 10.7, MCV 93.1, Plt 342 09/02/2022: WBC 5.4, Hgb 8.8, MCV 75.1, Plt 392   Interval History:  Caitlyn Page 56 y.o. female with medical history significant for iron deficiency anemia presents for a follow up visit. The patient's last visit was on 06/19/2020. In the interim since the last visit she was lost to follow up but presents today to re-establish.   On exam today Caitlyn Page notes she just had about 4 IV iron infusions 1.5 months ago in [place].  She reports that she recently had a COVID infection and has not really quite recovered.  She does still feel like her balance is off and feels dizzy.  She has trouble with restless leg and ice cravings.  Overall she is suffering from a terrible fatigue.  She notes that she is worried that her kidney function is close to stage III kidney disease.  She does that she recently stopped her iron pills due to GI distress.  She does have a swallowing issue and large hiatal hernia with ulcers which she thinks may be the etiology of her iron deficiency.  She does have occasional issues with chest pain and difficulty with swallowing, particularly pills.  She notes that she has been seen at [facility] but has a strong interest in transitioning her care to [place].  She has had no recent dark stools.  She notes that she does not have any nosebleeding or gum bleeding.  Her  menstrual cycles have been "way long gone" and she has had none in over 15 years.  Otherwise she currently denies having any issues with shortness of breath, chest pain, fevers, chills, sweats, nausea, running or diarrhea.  A full 10 point ROS is listed below.  MEDICAL HISTORY:  Past Medical History:  Diagnosis Date   Allergy    Anxiety    Arthritis    Chronic kidney disease    Depression    Frequent headaches    GERD (gastroesophageal reflux disease)    Iron deficiency anemia 12/19/2019   Legionella pneumonia ([place]) 02/09/2022   Prediabetes    Right foot pain    Sinus problem    Thyroid disease    Vision problems     SURGICAL HISTORY: Past Surgical History:  Procedure Laterality Date   sinus surgery   2000   TONSILLECTOMY AND ADENOIDECTOMY     Sometime in the 1980's    ALLERGIES:  is allergic to egg [eggs or egg-derived products], fructose, and gluten meal.  MEDICATIONS:  Current Outpatient Medications  Medication Sig Dispense Refill   methocarbamol (ROBAXIN) 500 MG tablet Take 500 mg by mouth daily as needed.     thyroid (ARMOUR) 30 MG tablet Take 30 mg by mouth daily before breakfast.     Current Facility-Administered Medications  Medication Dose Route Frequency Provider Last Rate Last Admin   0.9 %  sodium chloride  infusion  500 mL Intravenous Once Thornton Park, MD        REVIEW OF SYSTEMS:   Constitutional: ( - ) fevers, ( - )  chills , ( - ) night sweats Eyes: ( - ) blurriness of vision, ( - ) double vision, ( - ) watery eyes Ears, nose, mouth, throat, and face: ( - ) mucositis, ( - ) sore throat Respiratory: ( - ) cough, ( - ) dyspnea, ( - ) wheezes Cardiovascular: ( - ) palpitation, ( - ) chest discomfort, ( - ) lower extremity swelling Gastrointestinal:  ( - ) nausea, ( - ) heartburn, ( - ) change in bowel habits Skin: ( - ) abnormal skin rashes Lymphatics: ( - ) new lymphadenopathy, ( - ) easy bruising Neurological: ( - ) numbness, ( - ) tingling, ( - )  new weaknesses Behavioral/Psych: ( - ) mood change, ( - ) new changes  All other systems were reviewed with the patient and are negative.  PHYSICAL EXAMINATION: ECOG PERFORMANCE STATUS: 1 - Symptomatic but completely ambulatory  Vitals:   09/02/22 0837  BP: 124/85  Pulse: 86  Resp: 15  Temp: 98.5 F (36.9 C)  SpO2: 100%   Filed Weights   09/02/22 0837  Weight: 193 lb 1.6 oz (87.6 kg)    GENERAL: well appearing middle aged Caucasian female. alert, no distress and comfortable SKIN: skin color, texture, turgor are normal, no rashes or significant lesions EYES: conjunctiva are pink and non-injected, sclera clear LUNGS: clear to auscultation and percussion with normal breathing effort HEART: regular rate & rhythm and no murmurs and no lower extremity edema Musculoskeletal: no cyanosis of digits and no clubbing  PSYCH: alert & oriented x 3, fluent speech NEURO: no focal motor/sensory deficits  LABORATORY DATA:  I have reviewed the data as listed    Latest Ref Rng & Units 09/02/2022    8:11 AM 02/02/2022    3:43 PM 04/27/2021    4:45 AM  CBC  WBC 4.0 - 10.5 K/uL 5.4  6.1  5.6   Hemoglobin 12.0 - 15.0 g/dL 8.8  10.2  11.5   Hematocrit 36.0 - 46.0 % 28.9  33.4  35.4   Platelets 150 - 400 K/uL 392  405  323        Latest Ref Rng & Units 09/02/2022    8:11 AM 02/02/2022    3:43 PM 04/27/2021    4:45 AM  CMP  Glucose 70 - 99 mg/dL 95  107  93   BUN 6 - 20 mg/dL '12  14  17   '$ Creatinine 0.44 - 1.00 mg/dL 1.07  1.11  0.97   Sodium 135 - 145 mmol/L 140  140  138   Potassium 3.5 - 5.1 mmol/L 3.9  4.0  4.3   Chloride 98 - 111 mmol/L 107  108  105   CO2 22 - 32 mmol/L '26  27  25   '$ Calcium 8.9 - 10.3 mg/dL 9.1  9.0  9.1   Total Protein 6.5 - 8.1 g/dL 6.9     Total Bilirubin 0.3 - 1.2 mg/dL 0.3     Alkaline Phos 38 - 126 U/L 84     AST 15 - 41 U/L 18     ALT 0 - 44 U/L 12       No results found for: "MPROTEIN" Lab Results  Component Value Date   KPAFRELGTCHN 14.5 12/18/2019    LAMBDASER 13.0 12/18/2019   KAPLAMBRATIO 1.12 12/18/2019  RADIOGRAPHIC STUDIES: No results found.  ASSESSMENT & PLAN Caitlyn Page 56 y.o. female with medical history significant for iron deficiency anemia presents for a follow up visit.  After review the labs and discussion with the patient her findings are most consistent with an iron deficiency anemia that has been adequately treated with IV iron therapy.    In terms of the cause of iron deficiency anemia does not entirely clear at this time.  There is no evidence of inflammation, GYN bleeding, or GI bleeding based on the EGD and colonoscopy.  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  It is worth noting that the patient does not consume a lot of iron in her regular diet and this could potentially be the cause.  At this time we will reconnect the patient with GI services.  She would like not to go back to [place] and has requested referral to [facility].  #Iron Deficiency Anemia 2/2 to Unclear Etiology, like GI bleeding --today will repeat CBC, CMP and also assess reticulocyte panel and iron panel -- recommend evaluation by GI.  Patient previously seen at [facility] would like to transfer care to [place].  We will place the referral today. -- Patient unable to tolerate ferrous sulfate '325mg'$   --If iron levels are low we will administer more IV iron therapy. --RTC in 3 months or sooner if new symptoms develop.  Orders Placed This Encounter  Procedures   Ambulatory referral to Gastroenterology    Referral Priority:   Routine    Referral Type:   Consultation    Referral Reason:   Specialty Services Required    Number of Visits Requested:   1   All questions were answered. The patient knows to call the clinic with any problems, questions or concerns.  A total of more than 30 minutes were spent on this encounter and over half of  that time was spent on counseling and coordination of care as outlined above.   Caitlyn Peoples, MD Department of Hematology/Oncology [place] [facility] Phone: [phone] Pager: [phone] Email: [email or web address]  09/06/2022 8:10 PM

## 2022-09-03 ENCOUNTER — Telehealth: Payer: Self-pay | Admitting: Pharmacy Technician

## 2022-09-03 NOTE — Telephone Encounter (Signed)
Dr. Lorenso Courier, Juluis Rainier note:  Auth Submission: NO AUTH NEEDED Payer: [employer]BS [place] Medication & CPT/J Code(s) submitted: Monoferric (Ferrci derisomaltose) [phone] Route of submission (phone, fax, portal):  Phone # Fax # Auth type: Buy/Bill Units/visits requested: X1 Reference number: [id number] Approval from: 09/13/22 to 11/15/22   Patient will be scheduled as soon as possible  Monoferric co-pay card: Pending

## 2022-09-04 ENCOUNTER — Ambulatory Visit (INDEPENDENT_AMBULATORY_CARE_PROVIDER_SITE_OTHER): Payer: BC Managed Care – PPO

## 2022-09-04 VITALS — BP 105/73 | HR 83 | Temp 98.3°F | Resp 16 | Ht 64.0 in | Wt 195.0 lb

## 2022-09-04 DIAGNOSIS — D509 Iron deficiency anemia, unspecified: Secondary | ICD-10-CM

## 2022-09-04 MED ORDER — SODIUM CHLORIDE 0.9 % IV SOLN
1000.0000 mg | Freq: Once | INTRAVENOUS | Status: AC
  Administered 2022-09-04: 1000 mg via INTRAVENOUS
  Filled 2022-09-04: qty 10

## 2022-09-04 NOTE — Progress Notes (Addendum)
Diagnosis: Iron Deficiency Anemia  Provider:  Marshell Garfinkel MD  Procedure: Infusion  IV Type: Peripheral, IV Location: R Hand  Monoferric (Ferric Derisomaltose), Dose: 1000 ml  Infusion Start Time: 6967  Infusion Stop Time: 1020  Post Infusion IV Care: Observation period completed and Peripheral IV Discontinued  Discharge: Condition: Good, Destination: Home . AVS provided to patient.   Performed by:  Adelina Mings, LPN

## 2022-09-06 ENCOUNTER — Encounter: Payer: Self-pay | Admitting: Hematology and Oncology

## 2022-09-10 ENCOUNTER — Telehealth: Payer: Self-pay | Admitting: *Deleted

## 2022-09-10 NOTE — Telephone Encounter (Signed)
Received vm message from pt inquiring about the referral Dr. Lorenso Courier did to [facility]. TCT patient. LVM message. Advised that the referral was sent on 09/02/22  so that office should have the referral. Provided phone # to that office and encouraged pt to call to set up appt. Advised to call back with any questions or concerns.

## 2022-09-11 ENCOUNTER — Encounter: Payer: Self-pay | Admitting: Hematology and Oncology

## 2022-09-23 ENCOUNTER — Telehealth: Payer: Self-pay | Admitting: Gastroenterology

## 2022-09-23 DIAGNOSIS — D500 Iron deficiency anemia secondary to blood loss (chronic): Secondary | ICD-10-CM | POA: Diagnosis not present

## 2022-09-23 DIAGNOSIS — K449 Diaphragmatic hernia without obstruction or gangrene: Secondary | ICD-10-CM | POA: Diagnosis not present

## 2022-09-23 NOTE — Progress Notes (Unsigned)
Referring Provider: No ref. provider found Primary Care Physician:  System, Provider Not In  Reason for Consultation:  Iron deficiency anemia   IMPRESSION:  Iron deficiency Anemia of unclear etiology  PLAN: Obtain records from [facility] and [facility]   HPI: Caitlyn Page is a 56 y.o. female seen for evaluation of iron deficiency anemia. She is under evaluation for hiatal hernia repair with [facility]. They are requesting EGD and colonoscopy prior to hiatal hernia repair.   She identifies herself as a chronic candida survivor. Has a history of hypothyroidism, anxiety, insulin resistance, vestibular migraine, vertigo, compression fractures and peptic ulcer disease.    She was diagnosed with a hernia 7-8 years ago in [place] during the evaluation of peptic ulcer disease. Cured with fasting regimen and an all vegetable diet.  She had an EGD with balloon dilation and a colonoscopy at that time. She has been unable to obtain those results and cannot provide full details.   I last saw Caitlyn as a virtual consultation in 2020 for dysphagia to solids, reflux, and a large hiatal hernia. She had an EGD and colonoscopy at that time. EGD showed a tortuous and esophagus and a large hiatal hernia. Duodenal biopsies were normal. Colonoscopy showed a 3 mm transverse colon sessile serrated polyp.  Capsule endoscopy recommended but she did not schedule due to lack of insurance.  She has had symptomatic iron deficiency anemia requiring IV iron 2 months ago in [facility]. She did not tolerate oral iron due to GI distress. There has been no occult or overt GI bleeding. Recently seen by Dr. Lorenso Courier with Hematology who noted that the cause of iron deficiency is not clear but may be related to GI blood loss.   She has recently been seen at [facility]    There is no known family history of colon cancer or polyps. No family history of stomach cancer or other GI malignancy. No family  history of inflammatory bowel disease or celiac.    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 Authorizing Provider  methocarbamol (ROBAXIN) 500 MG tablet Take 500 mg by mouth daily as needed. 02/03/22   [provider]  thyroid (ARMOUR) 30 MG tablet Take 30 mg by mouth daily before breakfast.    [provider]    Current Outpatient Medications  Medication Sig Dispense Refill   methocarbamol (ROBAXIN) 500 MG tablet Take 500 mg by mouth daily as needed.     thyroid (ARMOUR) 30 MG tablet Take 30 mg by mouth daily before breakfast.     Current Facility-Administered Medications  Medication Dose Route Frequency Provider Last Rate Last Admin   0.9 %  sodium chloride infusion  500 mL Intravenous Once Thornton Park, MD        Allergies as of 09/24/2022 - Review  Complete 09/02/2022  Allergen Reaction Noted   Egg [eggs or egg-derived products] Nausea And Vomiting 12/18/2019   Fructose  12/21/2018   Gluten meal  12/21/2018    Family History  Problem Relation Age of Onset   Breast cancer Mother    Aneurysm Mother    Heart Problems Father    Headache Sister    Other Sister        vertigo   Colon cancer Neg Hx    Esophageal cancer Neg Hx    Stomach cancer Neg Hx    Rectal cancer Neg Hx     Social History   Socioeconomic History   Marital status: Married    Spouse name: Not on file   Number of children: 0   Years of education: Not on  file   Highest education level: Not on file  Occupational History   Not on file  Tobacco Use   Smoking status: Never   Smokeless tobacco: Never  Vaping Use   Vaping Use: Never used  Substance and Sexual Activity   Alcohol use: No   Drug use: No   Sexual activity: Not on file  Other Topics Concern   Not on file  Social History Narrative   Lives at home alone    Right handed   Caffeine: a few times per week   Social Determinants of Health   Financial Resource Strain: Not on file  Food Insecurity: Not on file  Transportation Needs: Not on file  Physical Activity: Not on file  Stress: Not on file  Social Connections: Not on file  Intimate Partner Violence: Not on file    Review of Systems: 12 system ROS is negative except as noted above.   Physical Exam: General:   Alert,  well-nourished, pleasant and cooperative in NAD Head:  Normocephalic and atraumatic. Eyes:  Sclera clear, no icterus.   Conjunctiva pink. Ears:  Normal auditory acuity. Nose:  No deformity, discharge,  or lesions. Mouth:  No deformity or lesions.   Neck:  Supple; no masses or thyromegaly. Lungs:  Clear throughout to auscultation.   No wheezes. Heart:  Regular rate and rhythm; no murmurs. Abdomen:  Soft, nontender, nondistended, normal bowel sounds, no rebound or guarding. No hepatosplenomegaly.   Rectal:  Deferred  Msk:  Symmetrical. No boney deformities LAD: No inguinal or umbilical LAD Extremities:  No clubbing or edema. Neurologic:  Alert and  oriented x4;  grossly nonfocal Skin:  Intact without significant lesions or rashes. Psych:  Alert and cooperative. Normal mood and affect.    Aryaa Page L. Tarri Glenn, MD, MPH 09/23/2022, 8:24 PM

## 2022-09-23 NOTE — Telephone Encounter (Signed)
Patient called in to follow up on referral that was sent in on 09/02/22 by hematology office. States she will be having hiatal hernia repair with [facility] & prior to scheduling they are requesting that she have an EGD & colonoscopy to evaluate (dysphagia, IDA, and hx of cameron lesions). Patient last seen for endo colon on 07/07/2019 with Dr. Tarri Glenn.

## 2022-09-23 NOTE — Telephone Encounter (Signed)
Spoke with patient & she plans to be here tomorrow at 8:15 am for OV with Dr. Tarri Glenn. Address provided & advised her on when/where to go for appointment. Heather, CMA aware as well of add on.

## 2022-09-24 ENCOUNTER — Encounter: Payer: Self-pay | Admitting: Gastroenterology

## 2022-09-24 ENCOUNTER — Telehealth: Payer: Self-pay

## 2022-09-24 ENCOUNTER — Ambulatory Visit (INDEPENDENT_AMBULATORY_CARE_PROVIDER_SITE_OTHER): Payer: BC Managed Care – PPO | Admitting: Gastroenterology

## 2022-09-24 VITALS — BP 108/82 | HR 68 | Ht 64.0 in | Wt 194.2 lb

## 2022-09-24 DIAGNOSIS — D500 Iron deficiency anemia secondary to blood loss (chronic): Secondary | ICD-10-CM | POA: Diagnosis not present

## 2022-09-24 DIAGNOSIS — K449 Diaphragmatic hernia without obstruction or gangrene: Secondary | ICD-10-CM | POA: Diagnosis not present

## 2022-09-24 DIAGNOSIS — K222 Esophageal obstruction: Secondary | ICD-10-CM | POA: Diagnosis not present

## 2022-09-24 MED ORDER — NA SULFATE-K SULFATE-MG SULF 17.5-3.13-1.6 GM/177ML PO SOLN: 1.0000 | Freq: Once | ORAL | 0 refills | Status: AC

## 2022-09-24 NOTE — Patient Instructions (Addendum)
Please continue your omeprazole.  We will plan an upper endoscopy (push enteroscopy) and colonoscopy for further evaluation.   Tips for colonoscopy:  - Stay well hydrated for 3-4 days prior to the exam. This reduces nausea and dehydration.  - To prevent skin/hemorrhoid irritation - prior to wiping, put A&Dointment or vaseline on the toilet paper. - Keep a towel or pad on the bed.  - Drink  64oz of clear liquids in the morning of prep day (prior to starting the prep) to be sure that there is enough fluid to flush the colon and stay hydrated!!!! This is in addition to the fluids required for preparation. - Use of a flavored hard candy, such as grape Anise Salvo, can counteract some of the flavor of the prep and may prevent some nausea.    _______________________________________________________  If you are age 55 or older, your body mass index should be between 23-30. Your Body mass index is 33.34 kg/m. If this is out of the aforementioned range listed, please consider follow up with your Primary Care Provider.  If you are age 18 or younger, your body mass index should be between 19-25. Your Body mass index is 33.34 kg/m. If this is out of the aformentioned range listed, please consider follow up with your Primary Care Provider.   ________________________________________________________  The  GI providers would like to encourage you to use [facility] to communicate with providers for non-urgent requests or questions.  Due to long hold times on the telephone, sending your provider a message by [facility] may be a faster and more efficient way to get a response.  Please allow 48 business hours for a response.  Please remember that this is for non-urgent requests.  _______________________________________________________

## 2022-09-24 NOTE — Telephone Encounter (Addendum)
Patient called back returning your call that she missed

## 2022-09-24 NOTE — Telephone Encounter (Signed)
Spoke with patient & advised that we have [facility] records, but missing [employer]. She is going to request records be faxed.

## 2022-09-24 NOTE — Telephone Encounter (Signed)
Called patient & asked that she request her records from [place] and [place] be sent to our fax [phone] to Dr. Tarri Glenn. Pt verbalized all understanding.

## 2022-10-20 ENCOUNTER — Telehealth: Payer: Self-pay

## 2022-10-20 NOTE — Telephone Encounter (Signed)
Left message for patient to call back. Need records from [place].

## 2022-10-21 NOTE — Telephone Encounter (Signed)
Left message for patient to call back  

## 2022-10-22 NOTE — Telephone Encounter (Signed)
Patient returned call & confirmed that it was two EGD's she had done with Dr. Eber Jones at [facility]. Records should be faxed over today. She had also previously signed a form & requested them at time of last OV.

## 2022-10-22 NOTE — Telephone Encounter (Signed)
Unable to reach patient x 3. Left message for patient to call back.

## 2022-10-22 NOTE — Telephone Encounter (Signed)
Per [facility] they are going to fax over EGD records from 2021 & 2022.

## 2022-10-22 NOTE — Telephone Encounter (Signed)
Records received and placed on Dr. Tarri Glenn desk.

## 2022-10-26 ENCOUNTER — Other Ambulatory Visit: Payer: Self-pay

## 2022-10-26 NOTE — Telephone Encounter (Signed)
Patient returning your call.

## 2022-10-26 NOTE — Telephone Encounter (Signed)
Inbound call from [employer] stating she was exposed to Covid last Wednesday. Patient Covid test was negative on Saturday. States she will take another test and give [place] a call back with results. Please advise.

## 2022-10-26 NOTE — Telephone Encounter (Signed)
Patient called back in stating she had been eating nuts & salad frequently over the last 5 days, and is concerned about the prep. Discussed with Dr. Tarri Glenn via secure chat & she would like for patient to add on Magnesium Citrate 300 mL x one. Patient has been advised & will pick this up from pharmacy. Discussed the remaining prep in detail & answered all questions.

## 2022-10-26 NOTE — Telephone Encounter (Signed)
Left message for patient to call back  

## 2022-10-26 NOTE — Telephone Encounter (Signed)
Spoke with patient & advised that she proceed with procedure as planned as long as she continues to remain without symptoms and negative covid test. Pt verbalized all understanding.

## 2022-10-27 ENCOUNTER — Encounter: Payer: Self-pay | Admitting: Gastroenterology

## 2022-10-27 ENCOUNTER — Ambulatory Visit (AMBULATORY_SURGERY_CENTER): Payer: BC Managed Care – PPO | Admitting: Gastroenterology

## 2022-10-27 VITALS — BP 109/70 | HR 62 | Temp 96.8°F | Resp 13 | Ht 64.0 in | Wt 194.0 lb

## 2022-10-27 DIAGNOSIS — K449 Diaphragmatic hernia without obstruction or gangrene: Secondary | ICD-10-CM

## 2022-10-27 DIAGNOSIS — D500 Iron deficiency anemia secondary to blood loss (chronic): Secondary | ICD-10-CM

## 2022-10-27 MED ORDER — SODIUM CHLORIDE 0.9 % IV SOLN: 500.0000 mL | Freq: Once | INTRAVENOUS | Status: DC

## 2022-10-27 NOTE — Progress Notes (Signed)
1400 - Endoscopy resumed per Dr. Tarri Glenn.

## 2022-10-27 NOTE — Op Note (Addendum)
[facility] Patient Name: Caitlyn Page Procedure Date: 10/27/2022 1:33 PM MRN: [id number] Endoscopist: Thornton Park MD, MD, [id number] Age: 56 Referring MD:  Date of Birth: December 06, 1965 Gender: Female Account #: [id number] Procedure:                Upper GI endoscopy Indications:              Unexplained iron deficiency anemia Medicines:                Monitored Anesthesia Care Procedure:                Pre-Anesthesia Assessment:                           - Prior to the procedure, a History and Physical                            was performed, and patient medications and                            allergies were reviewed. The patient's tolerance of                            previous anesthesia was also reviewed. The risks                            and benefits of the procedure and the sedation                            options and risks were discussed with the patient.                            All questions were answered, and informed consent                            was obtained. Prior Anticoagulants: The patient has                            taken no anticoagulant or antiplatelet agents. ASA                            Grade Assessment: II - A patient with mild systemic                            disease. After reviewing the risks and benefits,                            the patient was deemed in satisfactory condition to                            undergo the procedure.                           After obtaining informed consent, the endoscope was  passed under direct vision. Throughout the                            procedure, the patient's blood pressure, pulse, and                            oxygen saturations were monitored continuously. The                            [id number] [id number]-[id number] Slim SB Colonoscope was introduced                            through the mouth, and advanced to the mid-jejunum.                            The upper  GI endoscopy was accomplished without                            difficulty. The patient tolerated the procedure                            well. Scope In: Scope Out: Findings:                 The examined esophagus was normal. The z-line is                            located 30 cm from the incisors.                           A large hiatal hernia was present. There were 3                            small Cameron's erosions.                           The entire examined stomach was normal except for                            mild erythema in the antrum and erythema with any                            scope contact. Biopsies were taken from the antrum,                            body, and fundus with a cold forceps for histology.                            Estimated blood loss was minimal.                           The examined duodenum was normal. This was biopsied                            with  a cold forceps for histology. Estimated blood                            loss was minimal.                           The examined jejunum was normal. This was biopsied                            with a cold forceps for histology. Estimated blood                            loss was minimal.                           The cardia and gastric fundus were normal on                            retroflexion. Complications:            No immediate complications. Estimated Blood Loss:     Estimated blood loss was minimal. Impression:               - Normal esophagus.                           - Large hiatal hernia with a few small Cameron's                            erosions.                           - Normal small bowel. Biopsied. Recommendation:           - Patient has a contact number available for                            emergencies. The signs and symptoms of potential                            delayed complications were discussed with the                            patient. Return to normal  activities tomorrow.                            Written discharge instructions were provided to the                            patient.                           - Resume previous diet.                           - Continue present medications.                           -  Await pathology results.                           - Follow-up with surgery as planned for repair of                            hiatal hernia.                           - No plans for capsule endoscopy at this time given                            the results of the endoscopic evaluation today. Thornton Park MD, MD 10/27/2022 2:30:05 PM This report has been signed electronically.

## 2022-10-27 NOTE — Patient Instructions (Signed)
YOU HAD AN ENDOSCOPIC PROCEDURE TODAY AT [facility]:   Refer to the procedure report that was given to you for any specific questions about what was found during the examination.  If the procedure report does not answer your questions, please call your gastroenterologist to clarify.  If you requested that your care partner not be given the details of your procedure findings, then the procedure report has been included in a sealed envelope for you to review at your convenience later.  **Handout given on Hiatal Hernia**  YOU SHOULD EXPECT: Some feelings of bloating in the abdomen. Passage of more gas than usual.  Walking can help get rid of the air that was put into your GI tract during the procedure and reduce the bloating. If you had a lower endoscopy (such as a colonoscopy or flexible sigmoidoscopy) you may notice spotting of blood in your stool or on the toilet paper. If you underwent a bowel prep for your procedure, you may not have a normal bowel movement for a few days.  Please Note:  You might notice some irritation and congestion in your nose or some drainage.  This is from the oxygen used during your procedure.  There is no need for concern and it should clear up in a day or so.  SYMPTOMS TO REPORT IMMEDIATELY:  Following lower endoscopy (colonoscopy or flexible sigmoidoscopy):  Excessive amounts of blood in the stool  Significant tenderness or worsening of abdominal pains  Swelling of the abdomen that is new, acute  Fever of 100F or higher  Following upper endoscopy (EGD)  Vomiting of blood or coffee ground material  New chest pain or pain under the shoulder blades  Painful or persistently difficult swallowing  New shortness of breath  Fever of 100F or higher  Black, tarry-looking stools  For urgent or emergent issues, a gastroenterologist can be reached at any hour by calling [phone]. Do not use MyChart messaging for urgent concerns.    DIET:  We do  recommend a small meal at first, but then you may proceed to your regular diet.  Drink plenty of fluids but you should avoid alcoholic beverages for 24 hours.  ACTIVITY:  You should plan to take it easy for the rest of today and you should NOT DRIVE or use heavy machinery until tomorrow (because of the sedation medicines used during the test).    FOLLOW UP: Our staff will call the number listed on your records the next business day following your procedure.  We will call around 7:15- 8:00 am to check on you and address any questions or concerns that you may have regarding the information given to you following your procedure. If we do not reach you, we will leave a message.     If any biopsies were taken you will be contacted by phone or by letter within the next 1-3 weeks.  Please call us at [phone] if you have not heard about the biopsies in 3 weeks.    SIGNATURES/CONFIDENTIALITY: You and/or your care partner have signed paperwork which will be entered into your electronic medical record.  These signatures attest to the fact that that the information above on your After Visit Summary has been reviewed and is understood.  Full responsibility of the confidentiality of this discharge information lies with you and/or your care-partner.

## 2022-10-27 NOTE — Progress Notes (Signed)
VS by CW  Pt's states no medical or surgical changes since previsit or office visit.  

## 2022-10-27 NOTE — Progress Notes (Signed)
1356 - scope removed to allow pt's O2 sat to recover. +ETCO2 noted during periods of poor ETCO2 waveform. 8 liter [place] flow. Will monitor.

## 2022-10-27 NOTE — Op Note (Signed)
[facility] Patient Name: Masa Lubin Procedure Date: 10/27/2022 1:31 PM MRN: [id number] Endoscopist: Thornton Park MD, MD, [id number] Age: 56 Referring MD:  Date of Birth: 23-Sep-1966 Gender: Female Account #: [id number] Procedure:                Colonoscopy Indications:              Unexplained iron deficiency anemia                           Sessile serrated polyp on colonoscopy 2020 Medicines:                Monitored Anesthesia Care Procedure:                Pre-Anesthesia Assessment:                           - Prior to the procedure, a History and Physical                            was performed, and patient medications and                            allergies were reviewed. The patient's tolerance of                            previous anesthesia was also reviewed. The risks                            and benefits of the procedure and the sedation                            options and risks were discussed with the patient.                            All questions were answered, and informed consent                            was obtained. Prior Anticoagulants: The patient has                            taken no anticoagulant or antiplatelet agents. ASA                            Grade Assessment: II - A patient with mild systemic                            disease. After reviewing the risks and benefits,                            the patient was deemed in satisfactory condition to                            undergo the procedure.  After obtaining informed consent, the colonoscope                            was passed under direct vision. Throughout the                            procedure, the patient's blood pressure, pulse, and                            oxygen saturations were monitored continuously. The                            CF [id number] #[id number] was introduced through the anus                            and advanced to the the  cecum, identified by                            appendiceal orifice and ileocecal valve. A second                            forward view of the right colon was performed. The                            colonoscopy was performed without difficulty. The                            patient tolerated the procedure well. The quality                            of the bowel preparation was excellent. The                            ileocecal valve, appendiceal orifice, and rectum                            were photographed. Scope In: 2:07:43 PM Scope Out: 2:18:22 PM Scope Withdrawal Time: 0 hours 8 minutes 56 seconds  Total Procedure Duration: 0 hours 10 minutes 39 seconds  Findings:                 The perianal and digital rectal examinations were                            normal.                           The entire examined colon appeared normal on direct                            and retroflexion views. Complications:            No immediate complications. Estimated Blood Loss:     Estimated blood loss: none. Impression:               - The entire examined  colon is normal on direct and                            retroflexion views.                           - No specimens collected. Recommendation:           - Patient has a contact number available for                            emergencies. The signs and symptoms of potential                            delayed complications were discussed with the                            patient. Return to normal activities tomorrow.                            Written discharge instructions were provided to the                            patient.                           - Resume previous diet.                           - Continue present medications.                           - Repeat colonoscopy in 5 years for surveillance                            given the history of sessile serrated polyp.                           - Emerging evidence  supports eating a diet of                            fruits, vegetables, grains, calcium, and yogurt                            while reducing red meat and alcohol may reduce the                            risk of colon cancer.                           - Thank you for allowing me to be involved in your                            colon cancer prevention. Thornton Park MD, MD 10/27/2022 2:35:22 PM This report has been signed electronically.

## 2022-10-27 NOTE — Progress Notes (Signed)
Called to room to assist during endoscopic procedure.  Patient ID and intended procedure confirmed with present staff. Received instructions for my participation in the procedure from the performing physician.  

## 2022-10-27 NOTE — Progress Notes (Signed)
Pt resting comfortably. VSS. Airway intact. SBAR complete to RN. All questions answered.   

## 2022-10-27 NOTE — Progress Notes (Signed)
   Referring Provider: Traci Sermon, PA* Primary Care Physician:  Traci Sermon, PA-C   Indication for Upper Endoscopy:  Iron deficiency anemia Indication for Colonoscopy:  Iron deficiency anemia   IMPRESSION:  Iron deficiency anemia Need for colon cancer screening Appropriate candidate for monitored anesthesia care  PLAN: Upper endoscopy and Colonoscopy in the [facility] today   HPI: Caitlyn Page is a 56 y.o. female presents for endoscopic evaluation of iron deficiency anemia.    Past Medical History:  Diagnosis Date   Allergy    Anxiety    Arthritis    Chronic kidney disease    Depression    Frequent headaches    GERD (gastroesophageal reflux disease)    Iron deficiency anemia 12/19/2019   Legionella pneumonia ([place]) 02/09/2022   Prediabetes    Right foot pain    Sinus problem    Thyroid disease    Vision problems     Past Surgical History:  Procedure Laterality Date   sinus surgery   2000   TONSILLECTOMY AND ADENOIDECTOMY     Sometime in the 1980's    Current Outpatient Medications  Medication Sig Dispense Refill   NP THYROID 90 MG tablet Take 90 mg by mouth. TAKE 1/2 TABLETS BY MOUTH EVERY DAY     omeprazole (PRILOSEC) 40 MG capsule Take 40 mg by mouth daily.     Current Facility-Administered Medications  Medication Dose Route Frequency Provider Last Rate Last Admin   0.9 %  sodium chloride infusion  500 mL Intravenous Once [place], MD        Allergies as of 10/27/2022 - Review Complete 10/27/2022  Allergen Reaction Noted   Egg [eggs or egg-derived products] Nausea And Vomiting 12/18/2019   Fructose  12/21/2018   Gluten meal  12/21/2018    Family History  Problem Relation Age of Onset   Breast cancer Mother    Aneurysm Mother    Heart Problems Father    Headache Sister    Other Sister        vertigo   Colon cancer Neg Hx    Esophageal cancer Neg Hx    Stomach cancer Neg Hx    Rectal cancer Neg Hx      Physical  Exam: General:   Alert,  well-nourished, pleasant and cooperative in NAD Head:  Normocephalic and atraumatic. Eyes:  Sclera clear, no icterus.   Conjunctiva pink. Mouth:  No deformity or lesions.   Neck:  Supple; no masses or thyromegaly. Lungs:  Clear throughout to auscultation.   No wheezes. Heart:  Regular rate and rhythm; no murmurs. Abdomen:  Soft, non-tender, nondistended, normal bowel sounds, no rebound or guarding.  Msk:  Symmetrical. No boney deformities LAD: No inguinal or umbilical LAD Extremities:  No clubbing or edema. Neurologic:  Alert and  oriented x4;  grossly nonfocal Skin:  No obvious rash or bruise. Psych:  Alert and cooperative. Normal mood and affect.     Studies/Results: No results found.    Caitlyn Page L. Tarri Glenn, MD, MPH 10/27/2022, 1:32 PM

## 2022-10-28 ENCOUNTER — Telehealth: Payer: Self-pay

## 2022-10-28 NOTE — Telephone Encounter (Signed)
  Follow up Call-     10/27/2022    1:00 PM  Call back number  Post procedure Call Back phone  # [phone]  Permission to leave phone message Yes     Patient questions:  Do you have a fever, pain , or abdominal swelling? No. Pain Score  0 *  Have you tolerated food without any problems? Yes.    Have you been able to return to your normal activities? Yes.    Do you have any questions about your discharge instructions: Diet   No. Medications  No. Follow up visit  No.  Do you have questions or concerns about your Care? No.  Actions: * If pain score is 4 or above: No action needed, pain <4.

## 2022-11-04 ENCOUNTER — Other Ambulatory Visit: Payer: Self-pay

## 2022-11-04 ENCOUNTER — Telehealth: Payer: Self-pay | Admitting: Gastroenterology

## 2022-11-04 DIAGNOSIS — K921 Melena: Secondary | ICD-10-CM

## 2022-11-04 DIAGNOSIS — D500 Iron deficiency anemia secondary to blood loss (chronic): Secondary | ICD-10-CM

## 2022-11-04 DIAGNOSIS — R195 Other fecal abnormalities: Secondary | ICD-10-CM

## 2022-11-04 MED ORDER — SUCRALFATE 1 G PO TABS: 1.0000 g | ORAL_TABLET | Freq: Four times a day (QID) | ORAL | 0 refills | Status: DC

## 2022-11-04 NOTE — Telephone Encounter (Signed)
Patient called in with complaints of black, non-tarry formed stools for the last 3 days which is new for her. Denies any pain, n/v. No recent pepto use or changes in diet. States that she also feels that she is experiencing "brain fog" which usually happens when her iron is low. Last iron infusion was in October. She recently had an endo colon on 10/27/22 with Dr. Tarri Glenn. Patient seeking further recommendations.

## 2022-11-04 NOTE — Telephone Encounter (Signed)
Spoke with patient regarding MD recommendations. Prescription has been sent to pharmacy & labs ordered. She plans to come in to the office tomorrow morning for lab work & has been advised on when/where to go.

## 2022-11-04 NOTE — Telephone Encounter (Signed)
Patient is calling states she is having problems since her procedure and is wishing to speak with the nurse. Please advise

## 2022-11-05 ENCOUNTER — Other Ambulatory Visit (INDEPENDENT_AMBULATORY_CARE_PROVIDER_SITE_OTHER): Payer: BC Managed Care – PPO

## 2022-11-05 DIAGNOSIS — D500 Iron deficiency anemia secondary to blood loss (chronic): Secondary | ICD-10-CM

## 2022-11-05 DIAGNOSIS — K921 Melena: Secondary | ICD-10-CM | POA: Diagnosis not present

## 2022-11-05 LAB — CBC WITH DIFFERENTIAL/PLATELET
Basophils Absolute: 0 10*3/uL (ref 0.0–0.1)
Basophils Relative: 0.7 % (ref 0.0–3.0)
Eosinophils Absolute: 0.2 10*3/uL (ref 0.0–0.7)
Eosinophils Relative: 3.2 % (ref 0.0–5.0)
HCT: 35.7 % — ABNORMAL LOW (ref 36.0–46.0)
Hemoglobin: 11.7 g/dL — ABNORMAL LOW (ref 12.0–15.0)
Lymphocytes Relative: 37 % (ref 12.0–46.0)
Lymphs Abs: 2 10*3/uL (ref 0.7–4.0)
MCHC: 32.8 g/dL (ref 30.0–36.0)
MCV: 87.4 fl (ref 78.0–100.0)
Monocytes Absolute: 0.4 10*3/uL (ref 0.1–1.0)
Monocytes Relative: 6.8 % (ref 3.0–12.0)
Neutro Abs: 2.9 10*3/uL (ref 1.4–7.7)
Neutrophils Relative %: 52.3 % (ref 43.0–77.0)
Platelets: 323 10*3/uL (ref 150.0–400.0)
RBC: 4.09 Mil/uL (ref 3.87–5.11)
RDW: 25.6 % — ABNORMAL HIGH (ref 11.5–15.5)
WBC: 5.5 10*3/uL (ref 4.0–10.5)

## 2022-11-05 LAB — BASIC METABOLIC PANEL
BUN: 10 mg/dL (ref 6–23)
CO2: 29 mEq/L (ref 19–32)
Calcium: 9.3 mg/dL (ref 8.4–10.5)
Chloride: 103 mEq/L (ref 96–112)
Creatinine, Ser: 1.01 mg/dL (ref 0.40–1.20)
GFR: 62.53 mL/min (ref >60.00)
Glucose, Bld: 90 mg/dL (ref 70–99)
Potassium: 4.2 mEq/L (ref 3.5–5.1)
Sodium: 139 mEq/L (ref 135–145)

## 2022-11-05 NOTE — Telephone Encounter (Signed)
Patient called in to clarify medication sent to pharmacy. All questions answered, advised she call back with any other questions or concerns.

## 2022-11-11 NOTE — Progress Notes (Signed)
Caitlyn Page,  Can you call [name] today and see if her stools are still black?  Her blood counts looked reassuring.

## 2022-11-26 ENCOUNTER — Telehealth: Payer: Self-pay | Admitting: Gastroenterology

## 2022-11-26 NOTE — Telephone Encounter (Signed)
Documentation of outside records from [facility]   EGD with Dr. Rosanne Sack 11/23/2019 for abdominal pain and dysphagia showed a moderately severe Schatzki's ring in the distal esophagus with a stricture dilated using a 17 mm savory dilator over guidewire and a large hiatal hernia.  Mild gastritis was biopsied.  There was a single nonbleeding ulcer ranging from 3 to 5 mm in the gastric body.  Gastric biopsies showed reactive gastropathy without H. pylori.  Duodenal biopsies were normal.  Esophageal biopsies were normal.  EGD with Dr. Benjaman Lobe none 12/09/2020 for iron deficiency anemia, abdominal pain, heartburn, and recheck of a gastric ulcer.  Findings include gastritis, Cameron's ulcers seen in the hiatal hernia, and a large hiatal hernia.  Biopsies were obtained showing mild chronic gastric inflammation.  There was no H. pylori.  Duodenal biopsies were normal.

## 2022-11-30 DIAGNOSIS — R7303 Prediabetes: Secondary | ICD-10-CM | POA: Diagnosis not present

## 2022-11-30 DIAGNOSIS — E039 Hypothyroidism, unspecified: Secondary | ICD-10-CM | POA: Diagnosis not present

## 2022-11-30 DIAGNOSIS — E538 Deficiency of other specified B group vitamins: Secondary | ICD-10-CM | POA: Diagnosis not present

## 2022-11-30 DIAGNOSIS — K449 Diaphragmatic hernia without obstruction or gangrene: Secondary | ICD-10-CM | POA: Diagnosis not present

## 2022-11-30 DIAGNOSIS — E559 Vitamin D deficiency, unspecified: Secondary | ICD-10-CM | POA: Diagnosis not present

## 2022-11-30 DIAGNOSIS — D509 Iron deficiency anemia, unspecified: Secondary | ICD-10-CM | POA: Diagnosis not present

## 2022-12-03 ENCOUNTER — Inpatient Hospital Stay: Payer: BC Managed Care – PPO | Attending: Hematology and Oncology | Admitting: Hematology and Oncology

## 2022-12-03 ENCOUNTER — Other Ambulatory Visit: Payer: Self-pay | Admitting: Hematology and Oncology

## 2022-12-03 ENCOUNTER — Inpatient Hospital Stay: Payer: BC Managed Care – PPO

## 2022-12-03 VITALS — BP 125/86 | HR 57 | Temp 97.3°F | Resp 20 | Wt 195.4 lb

## 2022-12-03 DIAGNOSIS — D500 Iron deficiency anemia secondary to blood loss (chronic): Secondary | ICD-10-CM

## 2022-12-03 DIAGNOSIS — D509 Iron deficiency anemia, unspecified: Secondary | ICD-10-CM | POA: Diagnosis not present

## 2022-12-03 DIAGNOSIS — E079 Disorder of thyroid, unspecified: Secondary | ICD-10-CM | POA: Diagnosis not present

## 2022-12-03 DIAGNOSIS — Z79899 Other long term (current) drug therapy: Secondary | ICD-10-CM | POA: Diagnosis not present

## 2022-12-03 LAB — CBC WITH DIFFERENTIAL (CANCER CENTER ONLY)
Abs Immature Granulocytes: 0.01 10*3/uL (ref 0.00–0.07)
Basophils Absolute: 0 10*3/uL (ref 0.0–0.1)
Basophils Relative: 1 %
Eosinophils Absolute: 0.1 10*3/uL (ref 0.0–0.5)
Eosinophils Relative: 3 %
HCT: 37.6 % (ref 36.0–46.0)
Hemoglobin: 12.1 g/dL (ref 12.0–15.0)
Immature Granulocytes: 0 %
Lymphocytes Relative: 49 %
Lymphs Abs: 2.6 10*3/uL (ref 0.7–4.0)
MCH: 28.6 pg (ref 26.0–34.0)
MCHC: 32.2 g/dL (ref 30.0–36.0)
MCV: 88.9 fL (ref 80.0–100.0)
Monocytes Absolute: 0.4 10*3/uL (ref 0.1–1.0)
Monocytes Relative: 8 %
Neutro Abs: 2 10*3/uL (ref 1.7–7.7)
Neutrophils Relative %: 39 %
Platelet Count: 292 10*3/uL (ref 150–400)
RBC: 4.23 MIL/uL (ref 3.87–5.11)
RDW: 17.7 % — ABNORMAL HIGH (ref 11.5–15.5)
WBC Count: 5.2 10*3/uL (ref 4.0–10.5)
nRBC: 0 % (ref 0.0–0.2)

## 2022-12-03 LAB — IRON AND IRON BINDING CAPACITY (CC-WL,HP ONLY)
Iron: 51 ug/dL (ref 28–170)
Saturation Ratios: 13 % (ref 10.4–31.8)
TIBC: 386 ug/dL (ref 250–450)
UIBC: 335 ug/dL (ref 148–442)

## 2022-12-03 LAB — CMP (CANCER CENTER ONLY)
ALT: 16 U/L (ref 0–44)
AST: 18 U/L (ref 15–41)
Albumin: 3.7 g/dL (ref 3.5–5.0)
Alkaline Phosphatase: 75 U/L (ref 38–126)
Anion gap: 7 (ref 5–15)
BUN: 11 mg/dL (ref 6–20)
CO2: 26 mmol/L (ref 22–32)
Calcium: 9.3 mg/dL (ref 8.9–10.3)
Chloride: 108 mmol/L (ref 98–111)
Creatinine: 1.05 mg/dL — ABNORMAL HIGH (ref 0.44–1.00)
GFR, Estimated: >60 mL/min (ref >60)
Glucose, Bld: 95 mg/dL (ref 70–99)
Potassium: 4 mmol/L (ref 3.5–5.1)
Sodium: 141 mmol/L (ref 135–145)
Total Bilirubin: 0.3 mg/dL (ref 0.3–1.2)
Total Protein: 6.5 g/dL (ref 6.5–8.1)

## 2022-12-03 LAB — RETIC PANEL
Immature Retic Fract: 16.7 % — ABNORMAL HIGH (ref 2.3–15.9)
RBC.: 4.23 MIL/uL (ref 3.87–5.11)
Retic Count, Absolute: 37.2 10*3/uL (ref 19.0–186.0)
Retic Ct Pct: 0.9 % (ref 0.4–3.1)
Reticulocyte Hemoglobin: 30.5 pg (ref >27.9)

## 2022-12-03 LAB — FERRITIN: Ferritin: 13 ng/mL (ref 11–307)

## 2022-12-03 NOTE — Progress Notes (Signed)
[place] [phone]   [phone]  PROGRESS NOTE  Patient Care Team: Elpidio Eric as PCP - General (Physician Assistant)  Hematological/Oncological History # Iron Deficiency Anemia, Unclear Etiology    12/16/2018: WBC 4.8, Hgb 12.6, Plt 334, MCV 92.1.   10/24/2019: WBC 6.3, Hgb 9.2, MCV 84, Plt 481, RDW 17.2%, Cr. 1.2  12/18/2019: Establish care with Dr. Lorenso Courier. WBC 4.4, Hgb 8.9, Plt 366, MCV 78.4. Iron 12, Sat 3%, Ferritin 6, TIBC 431  03/18/2020: WBC 5.4, Hgb 12.9, MCV 92.6, Plt 292. Iron 49, TIBC 371, ferritin 29, Sat 13%  06/19/2020: WBC 5.8, Hgb 10.7, MCV 93.1, Plt 342 09/02/2022: WBC 5.4, Hgb 8.8, MCV 75.1, Plt 392 10/27/2022: colonoscopy and EGD showed no clear source of bleeding.     Interval History:  Caitlyn Page 57 y.o. female with medical history significant for iron deficiency anemia presents for a follow up visit. The patient's last visit was on 09/02/2022. In the interim since the last visit she underwent colonoscopy and EGD on 10/27/2022.   On exam today Caitlyn Page notes she has not been well in the last 3 months.  She reports that she has been having some cognitive issues and confusion.  She reports she has a lack of focus and unfortunately is at a loss for words at times.  She notes that she was evaluated by Dr. Tarri Glenn in gastroenterology and underwent GI colonoscopy and endoscopy with no concerning abnormalities noted.  She does have a hiatal hernia which was recommended to be repaired but she declined to have this done.  She reports that she is being evaluated for other possible causes for her symptoms, including heavy metal poisoning.  She notes that she simply does not feel like herself.  Otherwise she currently denies having any issues with shortness of breath, chest pain, fevers, chills, sweats, nausea, running or diarrhea.  A full 10 point ROS is listed below.  MEDICAL HISTORY:  Past Medical History:  Diagnosis Date    Allergy    Anxiety    Arthritis    Chronic kidney disease    Depression    Frequent headaches    GERD (gastroesophageal reflux disease)    Iron deficiency anemia 12/19/2019   Legionella pneumonia ([place]) 02/09/2022   Prediabetes    Right foot pain    Sinus problem    Thyroid disease    Vision problems     SURGICAL HISTORY: Past Surgical History:  Procedure Laterality Date   sinus surgery   2000   TONSILLECTOMY AND ADENOIDECTOMY     Sometime in the 1980's    ALLERGIES:  is allergic to egg [eggs or egg-derived products], fructose, and gluten meal.  MEDICATIONS:  Current Outpatient Medications  Medication Sig Dispense Refill   NP THYROID 15 MG tablet Take 15 mg by mouth every morning. Take 3 tabs daily     nystatin (MYCOSTATIN) 500000 units TABS tablet Take 500,000 Units by mouth in the morning and at bedtime.     omeprazole (PRILOSEC) 40 MG capsule Take 40 mg by mouth daily.     No current facility-administered medications for this visit.    REVIEW OF SYSTEMS:   Constitutional: ( - ) fevers, ( - )  chills , ( - ) night sweats Eyes: ( - ) blurriness of vision, ( - ) double vision, ( - ) watery eyes Ears, nose, mouth, throat, and face: ( - ) mucositis, ( - ) sore throat Respiratory: ( - )  cough, ( - ) dyspnea, ( - ) wheezes Cardiovascular: ( - ) palpitation, ( - ) chest discomfort, ( - ) lower extremity swelling Gastrointestinal:  ( - ) nausea, ( - ) heartburn, ( - ) change in bowel habits Skin: ( - ) abnormal skin rashes Lymphatics: ( - ) new lymphadenopathy, ( - ) easy bruising Neurological: ( - ) numbness, ( - ) tingling, ( - ) new weaknesses Behavioral/Psych: ( - ) mood change, ( - ) new changes  All other systems were reviewed with the patient and are negative.  PHYSICAL EXAMINATION: ECOG PERFORMANCE STATUS: 1 - Symptomatic but completely ambulatory  Vitals:   12/03/22 0844  BP: 125/86  Pulse: (!) 57  Resp: 20  Temp: (!) 97.3 F (36.3 C)  SpO2: 98%    Filed  Weights   12/03/22 0844  Weight: 195 lb 6.4 oz (88.6 kg)     GENERAL: well appearing middle aged Caucasian female. alert, no distress and comfortable SKIN: skin color, texture, turgor are normal, no rashes or significant lesions EYES: conjunctiva are pink and non-injected, sclera clear LUNGS: clear to auscultation and percussion with normal breathing effort HEART: regular rate & rhythm and no murmurs and no lower extremity edema Musculoskeletal: no cyanosis of digits and no clubbing  PSYCH: alert & oriented x 3, fluent speech NEURO: no focal motor/sensory deficits  LABORATORY DATA:  I have reviewed the data as listed    Latest Ref Rng & Units 12/03/2022    8:20 AM 11/05/2022   12:34 PM 09/02/2022    8:11 AM  CBC  WBC 4.0 - 10.5 K/uL 5.2  5.5  5.4   Hemoglobin 12.0 - 15.0 g/dL 12.1  11.7  8.8   Hematocrit 36.0 - 46.0 % 37.6  35.7  28.9   Platelets 150 - 400 K/uL 292  323.0  392        Latest Ref Rng & Units 12/03/2022    8:20 AM 11/05/2022   12:34 PM 09/02/2022    8:11 AM  CMP  Glucose 70 - 99 mg/dL 95  90  95   BUN 6 - 20 mg/dL '11  10  12   '$ Creatinine 0.44 - 1.00 mg/dL 1.05  1.01  1.07   Sodium 135 - 145 mmol/L 141  139  140   Potassium 3.5 - 5.1 mmol/L 4.0  4.2  3.9   Chloride 98 - 111 mmol/L 108  103  107   CO2 22 - 32 mmol/L '26  29  26   '$ Calcium 8.9 - 10.3 mg/dL 9.3  9.3  9.1   Total Protein 6.5 - 8.1 g/dL 6.5   6.9   Total Bilirubin 0.3 - 1.2 mg/dL 0.3   0.3   Alkaline Phos 38 - 126 U/L 75   84   AST 15 - 41 U/L 18   18   ALT 0 - 44 U/L 16   12     No results found for: "MPROTEIN" Lab Results  Component Value Date   KPAFRELGTCHN 14.5 12/18/2019   LAMBDASER 13.0 12/18/2019   KAPLAMBRATIO 1.12 12/18/2019    RADIOGRAPHIC STUDIES: No results found.  ASSESSMENT & PLAN Caitlyn Page 57 y.o. female with medical history significant for iron deficiency anemia presents for a follow up visit.  After review the labs and discussion with the patient her findings are  most consistent with an iron deficiency anemia that has been adequately treated with IV iron therapy.    In terms of  the cause of iron deficiency anemia does not entirely clear at this time.  There is no evidence of inflammation, GYN bleeding, or GI bleeding based on the EGD and colonoscopy.  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  It is worth noting that the patient does not consume a lot of iron in her regular diet and this could potentially be the cause.  At this time we will reconnect the patient with GI services.  She would like not to go back to [place] and has requested referral to [facility].  #Iron Deficiency Anemia 2/2 to Unclear Etiology, like GI bleeding --today will repeat CBC, CMP and also assess reticulocyte panel and iron panel -- recommend evaluation by GI.  She has connected with Dr. Tarri Glenn at [facility]. Colonoscopy/EGD performed on 10/27/2022.  -- Patient unable to tolerate ferrous sulfate '325mg'$   --patient has had no menstrual cycles x 15 years.  --If iron levels are low we will administer more IV iron therapy. No indication for IV iron today.  --labs today show white blood cell 5.2, hemoglobin 12.1, MCV 88.9, and platelets of 292. Iron studies pending.  --RTC in 6 months or sooner if new symptoms develop.  No orders of the defined types were placed in this encounter.  All questions were answered. The patient knows to call the clinic with any problems, questions or concerns.  A total of more than 30 minutes were spent on this encounter and over half of that time was spent on counseling and coordination of care as outlined above.   Caitlyn Peoples, MD Department of Hematology/Oncology [place] [facility] Phone: [phone] Pager: [phone] Email: [email or web address]  12/03/2022 4:52 PM

## 2022-12-08 DIAGNOSIS — U071 COVID-19: Secondary | ICD-10-CM | POA: Diagnosis not present

## 2022-12-08 DIAGNOSIS — Z6833 Body mass index (BMI) 33.0-33.9, adult: Secondary | ICD-10-CM | POA: Diagnosis not present

## 2022-12-11 DIAGNOSIS — G5761 Lesion of plantar nerve, right lower limb: Secondary | ICD-10-CM | POA: Diagnosis not present

## 2022-12-11 DIAGNOSIS — M7751 Other enthesopathy of right foot: Secondary | ICD-10-CM | POA: Diagnosis not present

## 2022-12-29 ENCOUNTER — Encounter: Payer: Self-pay | Admitting: Gastroenterology

## 2022-12-29 ENCOUNTER — Ambulatory Visit (INDEPENDENT_AMBULATORY_CARE_PROVIDER_SITE_OTHER): Payer: BC Managed Care – PPO | Admitting: Gastroenterology

## 2022-12-29 VITALS — BP 124/82 | HR 93 | Ht 64.0 in | Wt 195.0 lb

## 2022-12-29 DIAGNOSIS — D500 Iron deficiency anemia secondary to blood loss (chronic): Secondary | ICD-10-CM | POA: Diagnosis not present

## 2022-12-29 DIAGNOSIS — K449 Diaphragmatic hernia without obstruction or gangrene: Secondary | ICD-10-CM | POA: Diagnosis not present

## 2022-12-29 NOTE — Patient Instructions (Addendum)
I think your hiatal hernia is causing your symptoms and your iron deficiency.  Please follow-up with Dr. Kieth Brightly to discuss surgery for your hiatal hernia.  You should plan to have another colonoscopy in 2028, earlier with new symptoms.  Office follow-up as needed.

## 2022-12-29 NOTE — Progress Notes (Signed)
Referring Provider: Traci Sermon, PA* Primary Care Physician:  Traci Sermon, PA-C  Chief complaint:  Iron deficiency anemia   IMPRESSION:  Large, symptomatic hiatal hernia. 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 Discussed the way the hernia contributes to reflux. Encouraged PPI therapy.  Planning surgery with Dr. Kieth Brightly.   Iron deficiency Anemia of unclear etiology but Dr. Lorenso Courier is suspicious for GI blood loss anemia.  May be due to Cameron's lesions seen on recent EGD.  Schatzki's ring s/p dilation with Dr. Eber Jones. Dysphagia is not a prominent symptoms although she felt her swallowing improved after the prior dilation. Not seen on recent EGD.  Sessile serrated polyp on colonoscopy 2020.  No polyp present on colonoscopy 2023.  Surveillance previously recommended in 5 years.  BMI 33  PLAN: - Continue omeprazole - Reflux lifestyle modifications - Continue follow-up with surgeon and hematology as planned - Surveillance colonoscopy 2028  HPI: Caitlyn Page is a 57 y.o. female who returns in follow-up of iron deficiency anemia. She is under evaluation for hiatal hernia repair with [place] surgery.  She returns after endoscopic evaluation 10/27/2022. The interval history is obtained through the patient.  She identifies herself as a chronic candida survivor. Has a history of hypothyroidism, anxiety, insulin resistance, vestibular migraine, vertigo, compression fractures and peptic ulcer disease.  She started a new job in January that she is enjoying.   She was diagnosed with a hernia 7-8 years ago in [place] during the evaluation of peptic ulcer disease. Cured with fasting regimen and an all vegetable diet.  She had an EGD with balloon dilation and a colonoscopy at that time. She has been unable to obtain those results and cannot provide full details.   I initially met Caitlyn Page as a virtual consultation in 2020 for dysphagia to solids, reflux, and a large hiatal hernia. She  had an EGD and colonoscopy at that time. EGD showed a tortuous and esophagus and a large hiatal hernia. Gastric and duodenal biopsies were normal. Esophageal biopsies showed reflux. Colonoscopy showed a 3 mm transverse colon sessile serrated polyp. Surveillance colonoscopy recommended in 5 years.  Capsule endoscopy recommended but she did not schedule due to lack of insurance.  Barium esophagram 07/17/2019 showed a large type 3 hiatal hernia and a widely patent Schatzki's ring but the radiologist noted that it wasn't obstructing enough to be expected to be causing symptoms. No esophageal motility seen. A single episode of gastroesophageal reflux occurred.   She was then seen by Dr. Eber Jones at [facility] in 2021 and 2022. Unfortunately, I do not have access to those records. She had 2 EGDs including dilation on one of the EGDs with Dr. Eber Jones. Apparently one of them showed a Cameron's ulcer and a hernia. Caitlyn Page felt that she was not communicated these results completely or that a plan was in place for management.   She developed iron deficiency anemia in 2021. She reports a total of 5 iron infusions over the last several months but she continues to have intermittent symptoms. She did not tolerate oral iron due to GI distress. There has been no occult or overt GI bleeding. Recently seen by Dr. Lorenso Courier with Hematology who noted that the cause of iron deficiency is not clear but may be related to GI blood loss.   Colonoscopy 10/27/2022 was normal EGD 10/27/2022 showed a large hiatal hernia with 3 small Cameron's ulcers, mild gastritis and was otherwise normal.  Gastric and duodenal biopsies were normal.  She returns today reporting  ongoing symptoms with chest pain and acid reflux. She is considering having surgery but wants to time the recovery around her work schedule/busy travel schedule.  No overt bleeding. No new GI symptoms.   Most recent labs from 12/03/2022 showing normal CBC, iron 51, ferritin 13. Dr. Lorenso Courier  was pleased with her response to IV iron.   Past Medical History:  Diagnosis Date   Allergy    Anxiety    Arthritis    Chronic kidney disease    Depression    Frequent headaches    GERD (gastroesophageal reflux disease)    Iron deficiency anemia 12/19/2019   Legionella pneumonia ([place]) 02/09/2022   Prediabetes    Right foot pain    Sinus problem    Thyroid disease    Vision problems     Past Surgical History:  Procedure Laterality Date   sinus surgery   2000   TONSILLECTOMY AND ADENOIDECTOMY     Sometime in the 1980's     Current Outpatient Medications  Medication Sig Dispense Refill   NP THYROID 15 MG tablet Take 15 mg by mouth every morning. Take 3 tabs daily     nystatin (MYCOSTATIN) 500000 units TABS tablet Take 500,000 Units by mouth in the morning and at bedtime.     omeprazole (PRILOSEC) 40 MG capsule Take 40 mg by mouth daily.     No current facility-administered medications for this visit.    Allergies as of 12/29/2022 - Review Complete 12/29/2022  Allergen Reaction Noted   Egg [eggs or egg-derived products] Nausea And Vomiting 12/18/2019   Fructose  12/21/2018   Gluten meal  12/21/2018    Family History  Problem Relation Age of Onset   Breast cancer Mother    Aneurysm Mother    Heart Problems Father    Headache Sister    Other Sister        vertigo   Colon cancer Neg Hx    Esophageal cancer Neg Hx    Stomach cancer Neg Hx    Rectal cancer Neg Hx       Physical Exam: Gen: Awake, alert, and oriented, and well communicative. HEENT: EOMI, non-icteric sclera, NCAT, MMM  Neck: Normal movement of head and neck  Pulm: No labored breathing, speaking in full sentences without conversational dyspnea  Derm: No apparent lesions or bruising in visible field  MS: Moves all visible extremities without noticeable abnormality  Psych: Pleasant, cooperative, normal speech, thought processing seemingly intact      Caitlyn Tat L. Tarri Glenn, MD, MPH 12/29/2022, 10:02  AM

## 2023-01-20 DIAGNOSIS — Z1231 Encounter for screening mammogram for malignant neoplasm of breast: Secondary | ICD-10-CM | POA: Diagnosis not present

## 2023-02-03 DIAGNOSIS — J0100 Acute maxillary sinusitis, unspecified: Secondary | ICD-10-CM | POA: Diagnosis not present

## 2023-02-03 DIAGNOSIS — Z6833 Body mass index (BMI) 33.0-33.9, adult: Secondary | ICD-10-CM | POA: Diagnosis not present

## 2023-03-01 ENCOUNTER — Encounter: Payer: Self-pay | Admitting: *Deleted

## 2023-03-11 DIAGNOSIS — Z03818 Encounter for observation for suspected exposure to other biological agents ruled out: Secondary | ICD-10-CM | POA: Diagnosis not present

## 2023-03-11 DIAGNOSIS — R509 Fever, unspecified: Secondary | ICD-10-CM | POA: Diagnosis not present

## 2023-03-11 DIAGNOSIS — R5383 Other fatigue: Secondary | ICD-10-CM | POA: Diagnosis not present

## 2023-03-11 DIAGNOSIS — K449 Diaphragmatic hernia without obstruction or gangrene: Secondary | ICD-10-CM | POA: Diagnosis not present

## 2023-03-11 DIAGNOSIS — J22 Unspecified acute lower respiratory infection: Secondary | ICD-10-CM | POA: Diagnosis not present

## 2023-03-11 DIAGNOSIS — R051 Acute cough: Secondary | ICD-10-CM | POA: Diagnosis not present

## 2023-04-09 DIAGNOSIS — M7751 Other enthesopathy of right foot: Secondary | ICD-10-CM | POA: Diagnosis not present

## 2023-04-09 DIAGNOSIS — G5761 Lesion of plantar nerve, right lower limb: Secondary | ICD-10-CM | POA: Diagnosis not present

## 2023-04-21 DIAGNOSIS — K449 Diaphragmatic hernia without obstruction or gangrene: Secondary | ICD-10-CM | POA: Diagnosis not present

## 2023-04-21 DIAGNOSIS — N189 Chronic kidney disease, unspecified: Secondary | ICD-10-CM | POA: Diagnosis not present

## 2023-04-21 DIAGNOSIS — E669 Obesity, unspecified: Secondary | ICD-10-CM | POA: Diagnosis not present

## 2023-04-21 DIAGNOSIS — Z6833 Body mass index (BMI) 33.0-33.9, adult: Secondary | ICD-10-CM | POA: Diagnosis not present

## 2023-04-23 DIAGNOSIS — M7661 Achilles tendinitis, right leg: Secondary | ICD-10-CM | POA: Diagnosis not present

## 2023-05-11 DIAGNOSIS — R6881 Early satiety: Secondary | ICD-10-CM | POA: Diagnosis not present

## 2023-05-11 DIAGNOSIS — K259 Gastric ulcer, unspecified as acute or chronic, without hemorrhage or perforation: Secondary | ICD-10-CM | POA: Diagnosis not present

## 2023-05-11 DIAGNOSIS — R1310 Dysphagia, unspecified: Secondary | ICD-10-CM | POA: Diagnosis not present

## 2023-05-11 DIAGNOSIS — K449 Diaphragmatic hernia without obstruction or gangrene: Secondary | ICD-10-CM | POA: Diagnosis not present

## 2023-05-11 DIAGNOSIS — Z79899 Other long term (current) drug therapy: Secondary | ICD-10-CM | POA: Diagnosis not present

## 2023-05-11 DIAGNOSIS — Z79890 Hormone replacement therapy (postmenopausal): Secondary | ICD-10-CM | POA: Diagnosis not present

## 2023-05-18 DIAGNOSIS — K224 Dyskinesia of esophagus: Secondary | ICD-10-CM | POA: Diagnosis not present

## 2023-05-18 DIAGNOSIS — K219 Gastro-esophageal reflux disease without esophagitis: Secondary | ICD-10-CM | POA: Diagnosis not present

## 2023-05-18 DIAGNOSIS — K449 Diaphragmatic hernia without obstruction or gangrene: Secondary | ICD-10-CM | POA: Diagnosis not present

## 2023-05-19 DIAGNOSIS — N189 Chronic kidney disease, unspecified: Secondary | ICD-10-CM | POA: Diagnosis not present

## 2023-05-19 DIAGNOSIS — E669 Obesity, unspecified: Secondary | ICD-10-CM | POA: Diagnosis not present

## 2023-05-19 DIAGNOSIS — Z79899 Other long term (current) drug therapy: Secondary | ICD-10-CM | POA: Diagnosis not present

## 2023-05-19 DIAGNOSIS — Z6833 Body mass index (BMI) 33.0-33.9, adult: Secondary | ICD-10-CM | POA: Diagnosis not present

## 2023-05-19 DIAGNOSIS — K449 Diaphragmatic hernia without obstruction or gangrene: Secondary | ICD-10-CM | POA: Diagnosis not present

## 2023-05-21 ENCOUNTER — Telehealth: Payer: Self-pay | Admitting: Hematology and Oncology

## 2023-05-25 DIAGNOSIS — L0202 Furuncle of face: Secondary | ICD-10-CM | POA: Diagnosis not present

## 2023-05-25 DIAGNOSIS — B9689 Other specified bacterial agents as the cause of diseases classified elsewhere: Secondary | ICD-10-CM | POA: Diagnosis not present

## 2023-05-25 DIAGNOSIS — L821 Other seborrheic keratosis: Secondary | ICD-10-CM | POA: Diagnosis not present

## 2023-05-28 ENCOUNTER — Other Ambulatory Visit: Payer: Self-pay

## 2023-05-28 ENCOUNTER — Inpatient Hospital Stay (HOSPITAL_BASED_OUTPATIENT_CLINIC_OR_DEPARTMENT_OTHER): Payer: BC Managed Care – PPO | Admitting: Hematology and Oncology

## 2023-05-28 ENCOUNTER — Inpatient Hospital Stay: Payer: BC Managed Care – PPO | Attending: Hematology and Oncology

## 2023-05-28 ENCOUNTER — Other Ambulatory Visit: Payer: Self-pay | Admitting: Hematology and Oncology

## 2023-05-28 VITALS — BP 121/82 | HR 82 | Temp 98.4°F | Resp 16 | Ht 64.0 in | Wt 194.7 lb

## 2023-05-28 DIAGNOSIS — D500 Iron deficiency anemia secondary to blood loss (chronic): Secondary | ICD-10-CM

## 2023-05-28 DIAGNOSIS — D509 Iron deficiency anemia, unspecified: Secondary | ICD-10-CM | POA: Insufficient documentation

## 2023-05-28 DIAGNOSIS — K449 Diaphragmatic hernia without obstruction or gangrene: Secondary | ICD-10-CM | POA: Diagnosis not present

## 2023-05-28 LAB — CMP (CANCER CENTER ONLY)
ALT: 10 U/L (ref 0–44)
AST: 16 U/L (ref 15–41)
Albumin: 3.9 g/dL (ref 3.5–5.0)
Alkaline Phosphatase: 79 U/L (ref 38–126)
Anion gap: 7 (ref 5–15)
BUN: 12 mg/dL (ref 6–20)
CO2: 27 mmol/L (ref 22–32)
Calcium: 9.3 mg/dL (ref 8.9–10.3)
Chloride: 107 mmol/L (ref 98–111)
Creatinine: 1.09 mg/dL — ABNORMAL HIGH (ref 0.44–1.00)
GFR, Estimated: 60 mL/min — ABNORMAL LOW (ref >60)
Glucose, Bld: 89 mg/dL (ref 70–99)
Potassium: 4.2 mmol/L (ref 3.5–5.1)
Sodium: 141 mmol/L (ref 135–145)
Total Bilirubin: 0.3 mg/dL (ref 0.3–1.2)
Total Protein: 6.9 g/dL (ref 6.5–8.1)

## 2023-05-28 LAB — CBC WITH DIFFERENTIAL (CANCER CENTER ONLY)
Abs Immature Granulocytes: 0.01 10*3/uL (ref 0.00–0.07)
Basophils Absolute: 0 10*3/uL (ref 0.0–0.1)
Basophils Relative: 1 %
Eosinophils Absolute: 0.2 10*3/uL (ref 0.0–0.5)
Eosinophils Relative: 4 %
HCT: 27.2 % — ABNORMAL LOW (ref 36.0–46.0)
Hemoglobin: 8.2 g/dL — ABNORMAL LOW (ref 12.0–15.0)
Immature Granulocytes: 0 %
Lymphocytes Relative: 39 %
Lymphs Abs: 1.8 10*3/uL (ref 0.7–4.0)
MCH: 22.8 pg — ABNORMAL LOW (ref 26.0–34.0)
MCHC: 30.1 g/dL (ref 30.0–36.0)
MCV: 75.8 fL — ABNORMAL LOW (ref 80.0–100.0)
Monocytes Absolute: 0.3 10*3/uL (ref 0.1–1.0)
Monocytes Relative: 7 %
Neutro Abs: 2.3 10*3/uL (ref 1.7–7.7)
Neutrophils Relative %: 49 %
Platelet Count: 414 10*3/uL — ABNORMAL HIGH (ref 150–400)
RBC: 3.59 MIL/uL — ABNORMAL LOW (ref 3.87–5.11)
RDW: 17.2 % — ABNORMAL HIGH (ref 11.5–15.5)
WBC Count: 4.6 10*3/uL (ref 4.0–10.5)
nRBC: 0 % (ref 0.0–0.2)

## 2023-05-28 LAB — IRON AND IRON BINDING CAPACITY (CC-WL,HP ONLY)
Iron: 17 ug/dL — ABNORMAL LOW (ref 28–170)
Saturation Ratios: 4 % — ABNORMAL LOW (ref 10.4–31.8)
TIBC: 490 ug/dL — ABNORMAL HIGH (ref 250–450)
UIBC: 473 ug/dL — ABNORMAL HIGH (ref 148–442)

## 2023-05-28 LAB — RETIC PANEL
Immature Retic Fract: 32.8 % — ABNORMAL HIGH (ref 2.3–15.9)
RBC.: 3.56 MIL/uL — ABNORMAL LOW (ref 3.87–5.11)
Retic Count, Absolute: 52.3 10*3/uL (ref 19.0–186.0)
Retic Ct Pct: 1.5 % (ref 0.4–3.1)
Reticulocyte Hemoglobin: 19.9 pg — ABNORMAL LOW (ref >27.9)

## 2023-05-28 LAB — FERRITIN: Ferritin: 5 ng/mL — ABNORMAL LOW (ref 11–307)

## 2023-05-28 NOTE — Progress Notes (Unsigned)
[facility] Telephone:([phone]   Fax:([phone]  PROGRESS NOTE  Patient Care Team: Tamala Ser as PCP - General (Physician Assistant)  Hematological/Oncological History # Iron Deficiency Anemia, Unclear Etiology    12/16/2018: WBC 4.8, Hgb 12.6, Plt 334, MCV 92.1.   10/24/2019: WBC 6.3, Hgb 9.2, MCV 84, Plt 481, RDW 17.2%, Cr. 1.2  12/18/2019: Establish care with Dr. Leonides Schanz. WBC 4.4, Hgb 8.9, Plt 366, MCV 78.4. Iron 12, Sat 3%, Ferritin 6, TIBC 431  03/18/2020: WBC 5.4, Hgb 12.9, MCV 92.6, Plt 292. Iron 49, TIBC 371, ferritin 29, Sat 13%  06/19/2020: WBC 5.8, Hgb 10.7, MCV 93.1, Plt 342 09/02/2022: WBC 5.4, Hgb 8.8, MCV 75.1, Plt 392 10/27/2022: colonoscopy and EGD showed no clear source of bleeding.   Interval History:  Caitlyn Page 57 y.o. female with medical history significant for iron deficiency anemia presents for a follow up visit. The patient's last visit was on 12/03/2022. In the interim since the last visit she underwent colonoscopy and EGD on 10/27/2022.   On exam today Caitlyn Page notes she is now eating 5 glasses of ice per day.  She last had IV iron therapy in October 2023, 1 dose of Monoferric.  She reports that she has not had any overt signs of bleeding in the interim since her last visit.  She notes that she does have her large hiatal hernia which she believes is the source of her iron loss.  She notes her energy levels are actually feeling better, despite her low blood counts.  She notes that she is having some lightheadedness, dizziness, and shortness of breath on exertion.  She is exercising and walking less as a result of this.  She is not taking any iron pills due to stomach upset but is doing her best to try to eat iron rich foods such as steak, broccoli, spinach.  Otherwise she currently denies having any issues with shortness of breath, chest pain, fevers, chills, sweats, nausea, running or diarrhea.  A full 10 point ROS is listed  below.  MEDICAL HISTORY:  Past Medical History:  Diagnosis Date   Allergy    Anxiety    Arthritis    Chronic kidney disease    Depression    Frequent headaches    GERD (gastroesophageal reflux disease)    Iron deficiency anemia 12/19/2019   Legionella pneumonia (HCC) 02/09/2022   Prediabetes    Right foot pain    Sinus problem    Thyroid disease    Vision problems     SURGICAL HISTORY: Past Surgical History:  Procedure Laterality Date   sinus surgery   2000   TONSILLECTOMY AND ADENOIDECTOMY     Sometime in the 1980's    ALLERGIES:  is allergic to egg [egg-derived products], fructose, and gluten meal.  MEDICATIONS:  Current Outpatient Medications  Medication Sig Dispense Refill   NP THYROID 15 MG tablet Take 15 mg by mouth every morning. Take 3 tabs daily     nystatin (MYCOSTATIN) 500000 units TABS tablet Take 500,000 Units by mouth in the morning and at bedtime.     omeprazole (PRILOSEC) 40 MG capsule Take 40 mg by mouth daily.     No current facility-administered medications for this visit.    REVIEW OF SYSTEMS:   Constitutional: ( - ) fevers, ( - )  chills , ( - ) night sweats Eyes: ( - ) blurriness of vision, ( - ) double vision, ( - ) watery eyes Ears,  nose, mouth, throat, and face: ( - ) mucositis, ( - ) sore throat Respiratory: ( - ) cough, ( - ) dyspnea, ( - ) wheezes Cardiovascular: ( - ) palpitation, ( - ) chest discomfort, ( - ) lower extremity swelling Gastrointestinal:  ( - ) nausea, ( - ) heartburn, ( - ) change in bowel habits Skin: ( - ) abnormal skin rashes Lymphatics: ( - ) new lymphadenopathy, ( - ) easy bruising Neurological: ( - ) numbness, ( - ) tingling, ( - ) new weaknesses Behavioral/Psych: ( - ) mood change, ( - ) new changes  All other systems were reviewed with the patient and are negative.  PHYSICAL EXAMINATION: ECOG PERFORMANCE STATUS: 1 - Symptomatic but completely ambulatory  Vitals:   05/28/23 1017  BP: 121/82  Pulse: 82  Resp:  16  Temp: 98.4 F (36.9 C)  SpO2: 100%     Filed Weights   05/28/23 1017  Weight: 194 lb 11.2 oz (88.3 kg)      GENERAL: well appearing middle aged Caucasian female. alert, no distress and comfortable SKIN: skin color, texture, turgor are normal, no rashes or significant lesions EYES: conjunctiva are pink and non-injected, sclera clear LUNGS: clear to auscultation and percussion with normal breathing effort HEART: regular rate & rhythm and no murmurs and no lower extremity edema Musculoskeletal: no cyanosis of digits and no clubbing  PSYCH: alert & oriented x 3, fluent speech NEURO: no focal motor/sensory deficits  LABORATORY DATA:  I have reviewed the data as listed    Latest Ref Rng & Units 05/28/2023    9:05 AM 12/03/2022    8:20 AM 11/05/2022   12:34 PM  CBC  WBC 4.0 - 10.5 K/uL 4.6  5.2  5.5   Hemoglobin 12.0 - 15.0 g/dL 8.2  40.9  81.1   Hematocrit 36.0 - 46.0 % 27.2  37.6  35.7   Platelets 150 - 400 K/uL 414  292  323.0        Latest Ref Rng & Units 05/28/2023    9:05 AM 12/03/2022    8:20 AM 11/05/2022   12:34 PM  CMP  Glucose 70 - 99 mg/dL 89  95  90   BUN 6 - 20 mg/dL 12  11  10    Creatinine 0.44 - 1.00 mg/dL 9.14  7.82  9.56   Sodium 135 - 145 mmol/L 141  141  139   Potassium 3.5 - 5.1 mmol/L 4.2  4.0  4.2   Chloride 98 - 111 mmol/L 107  108  103   CO2 22 - 32 mmol/L 27  26  29    Calcium 8.9 - 10.3 mg/dL 9.3  9.3  9.3   Total Protein 6.5 - 8.1 g/dL 6.9  6.5    Total Bilirubin 0.3 - 1.2 mg/dL 0.3  0.3    Alkaline Phos 38 - 126 U/L 79  75    AST 15 - 41 U/L 16  18    ALT 0 - 44 U/L 10  16      No results found for: "MPROTEIN" Lab Results  Component Value Date   KPAFRELGTCHN 14.5 12/18/2019   LAMBDASER 13.0 12/18/2019   KAPLAMBRATIO 1.12 12/18/2019    RADIOGRAPHIC STUDIES: No results found.  ASSESSMENT & PLAN Caitlyn Page 57 y.o. female with medical history significant for iron deficiency anemia presents for a follow up visit.  After review  the labs and discussion with the patient her findings are most consistent with an  iron deficiency anemia that has been adequately treated with IV iron therapy.    In terms of the cause of iron deficiency anemia does not entirely clear at this time.  There is no evidence of inflammation, GYN bleeding, or GI bleeding based on the EGD and colonoscopy.  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  It is worth noting that the patient does not consume a lot of iron in her regular diet and this could potentially be the cause.  At this time we will reconnect the patient with GI services.  She would like not to go back to [place] and has requested referral to  GI.  #Iron Deficiency Anemia 2/2 to Unclear Etiology, like GI bleeding --today will repeat CBC, CMP and also assess reticulocyte panel and iron panel -- recommend evaluation by GI.  She has connected with Dr. Orvan Falconer at [place]. Colonoscopy/EGD performed on 10/27/2022.  -- Patient unable to tolerate ferrous sulfate 325mg  due to GI upset. --patient has had no menstrual cycles x 15 years.  --If iron levels are low we will administer more IV iron therapy. No indication for IV iron today.  --labs today show white blood cell 4.6, hemoglobin 8.2, MCV 75.8, and platelets of 414. Iron studies show iron sat of 4% with ferritin of 5. --Will plan to administer another dose of 1000 mg IV Monoferric. --RTC in 1 month   No orders of the defined types were placed in this encounter.  All questions were answered. The patient knows to call the clinic with any problems, questions or concerns.  A total of more than 30 minutes were spent on this encounter and over half of that time was spent on counseling and coordination of care as outlined above.   Ulysees Barns, MD Department of Hematology/Oncology [facility] at [facility] Phone:  [phone] Pager: [phone] Email: [email or web address]  05/30/2023 6:21 PM

## 2023-05-30 ENCOUNTER — Encounter: Payer: Self-pay | Admitting: Hematology and Oncology

## 2023-05-31 ENCOUNTER — Telehealth: Payer: Self-pay | Admitting: Pharmacy Technician

## 2023-05-31 NOTE — Telephone Encounter (Signed)
Dr. Leonides Schanz,  Monoferric is non-preferred with [employer] and will be denied if patient has not failed preferred medication. Preferred medication is Feraheme? Would you like to try to [facility].  Auth Submission: NO AUTH NEEDED Site of care: Site of care: [facility] Payer: BCBS  Medication & CPT/J Code(s) submitted:  Route of submission (phone, fax, portal):  Phone # Fax # Auth type: Buy/Bill Units/visits requested: 2 Reference number:  Approval from: 05/31/23 to 10/01/23

## 2023-06-03 ENCOUNTER — Inpatient Hospital Stay: Payer: BC Managed Care – PPO

## 2023-06-03 ENCOUNTER — Inpatient Hospital Stay: Payer: BC Managed Care – PPO | Admitting: Hematology and Oncology

## 2023-06-08 ENCOUNTER — Encounter: Payer: Self-pay | Admitting: Hematology and Oncology

## 2023-06-08 ENCOUNTER — Ambulatory Visit (INDEPENDENT_AMBULATORY_CARE_PROVIDER_SITE_OTHER): Payer: BC Managed Care – PPO | Admitting: *Deleted

## 2023-06-08 VITALS — BP 101/67 | HR 81 | Temp 97.9°F | Resp 16 | Ht 64.0 in | Wt 193.0 lb

## 2023-06-08 DIAGNOSIS — D509 Iron deficiency anemia, unspecified: Secondary | ICD-10-CM | POA: Diagnosis not present

## 2023-06-08 MED ORDER — DIPHENHYDRAMINE HCL 25 MG PO CAPS: 25.0000 mg | ORAL_CAPSULE | Freq: Once | ORAL | Status: DC

## 2023-06-08 MED ORDER — ACETAMINOPHEN 325 MG PO TABS: 650.0000 mg | ORAL_TABLET | Freq: Once | ORAL | Status: DC

## 2023-06-08 MED ORDER — SODIUM CHLORIDE 0.9 % IV SOLN
510.0000 mg | Freq: Once | INTRAVENOUS | Status: AC
  Administered 2023-06-08: 510 mg via INTRAVENOUS
  Filled 2023-06-08: qty 17

## 2023-06-08 NOTE — Progress Notes (Signed)
Diagnosis: Iron Deficiency Anemia  Provider:  Chilton Greathouse MD  Procedure: IV Infusion  IV Type: Peripheral, IV Location: R Hand  Feraheme (Ferumoxytol), Dose: 510 mg  Infusion Start Time: 1544 pm  Infusion Stop Time: 1610 pm  Post Infusion IV Care: Observation period completed and Peripheral IV Discontinued  Discharge: Condition: Good, Destination: Home . AVS Provided  Performed by:  Forrest Moron, RN

## 2023-06-10 ENCOUNTER — Telehealth: Payer: Self-pay | Admitting: Gastroenterology

## 2023-06-10 NOTE — Telephone Encounter (Signed)
Inbound call from [place] with [facility] requesting patient o/v notes from 12/29/22 be faxed over to [phone]. Please advise . Thank you

## 2023-06-10 NOTE — Telephone Encounter (Signed)
Note has been faxed as requested.

## 2023-06-15 ENCOUNTER — Ambulatory Visit: Payer: BC Managed Care – PPO

## 2023-06-15 VITALS — BP 98/70 | HR 87 | Temp 98.2°F | Resp 18 | Ht 64.0 in | Wt 197.4 lb

## 2023-06-15 DIAGNOSIS — D509 Iron deficiency anemia, unspecified: Secondary | ICD-10-CM

## 2023-06-15 MED ORDER — DIPHENHYDRAMINE HCL 25 MG PO CAPS: 25.0000 mg | ORAL_CAPSULE | Freq: Once | ORAL | Status: DC

## 2023-06-15 MED ORDER — ACETAMINOPHEN 325 MG PO TABS: 650.0000 mg | ORAL_TABLET | Freq: Once | ORAL | Status: DC

## 2023-06-15 MED ORDER — SODIUM CHLORIDE 0.9 % IV SOLN
510.0000 mg | Freq: Once | INTRAVENOUS | Status: AC
  Administered 2023-06-15: 510 mg via INTRAVENOUS
  Filled 2023-06-15: qty 17

## 2023-06-15 NOTE — Progress Notes (Signed)
Diagnosis: Iron Deficiency Anemia  Provider:  Chilton Greathouse MD  Procedure: IV Infusion  IV Type: Peripheral, IV Location: R wrist  Feraheme (Ferumoxytol), Dose: 510 mg  Infusion Start Time: 1355  Infusion Stop Time: 1413  Post Infusion IV Care: Observation period completed. PIV removed  Discharge: Condition: Good, Destination: Home . AVS Declined  Performed by:  Wyvonne Lenz, RN

## 2023-06-15 NOTE — Addendum Note (Signed)
Addended by: Primus Bravo E on: 06/15/2023 02:55 PM   Modules accepted: Orders

## 2023-06-22 ENCOUNTER — Inpatient Hospital Stay: Payer: BC Managed Care – PPO

## 2023-06-30 ENCOUNTER — Telehealth: Payer: Self-pay | Admitting: Hematology and Oncology

## 2023-07-01 ENCOUNTER — Inpatient Hospital Stay: Payer: BC Managed Care – PPO | Admitting: Physician Assistant

## 2023-07-09 DIAGNOSIS — M5459 Other low back pain: Secondary | ICD-10-CM | POA: Diagnosis not present

## 2023-07-12 DIAGNOSIS — M5459 Other low back pain: Secondary | ICD-10-CM | POA: Diagnosis not present

## 2023-07-16 DIAGNOSIS — M5459 Other low back pain: Secondary | ICD-10-CM | POA: Diagnosis not present

## 2023-07-20 DIAGNOSIS — M5459 Other low back pain: Secondary | ICD-10-CM | POA: Diagnosis not present

## 2023-07-22 ENCOUNTER — Other Ambulatory Visit: Payer: Self-pay | Admitting: Hematology and Oncology

## 2023-07-22 DIAGNOSIS — D500 Iron deficiency anemia secondary to blood loss (chronic): Secondary | ICD-10-CM

## 2023-07-22 DIAGNOSIS — M5459 Other low back pain: Secondary | ICD-10-CM | POA: Diagnosis not present

## 2023-07-23 ENCOUNTER — Inpatient Hospital Stay: Payer: BC Managed Care – PPO | Attending: Hematology

## 2023-07-23 DIAGNOSIS — D500 Iron deficiency anemia secondary to blood loss (chronic): Secondary | ICD-10-CM

## 2023-07-23 DIAGNOSIS — D509 Iron deficiency anemia, unspecified: Secondary | ICD-10-CM | POA: Diagnosis not present

## 2023-07-23 LAB — CBC WITH DIFFERENTIAL (CANCER CENTER ONLY)
Abs Immature Granulocytes: 0.01 10*3/uL (ref 0.00–0.07)
Basophils Absolute: 0 10*3/uL (ref 0.0–0.1)
Basophils Relative: 1 %
Eosinophils Absolute: 0.2 10*3/uL (ref 0.0–0.5)
Eosinophils Relative: 5 %
HCT: 38.2 % (ref 36.0–46.0)
Hemoglobin: 12.7 g/dL (ref 12.0–15.0)
Immature Granulocytes: 0 %
Lymphocytes Relative: 41 %
Lymphs Abs: 1.9 10*3/uL (ref 0.7–4.0)
MCH: 28.7 pg (ref 26.0–34.0)
MCHC: 33.2 g/dL (ref 30.0–36.0)
MCV: 86.2 fL (ref 80.0–100.0)
Monocytes Absolute: 0.4 10*3/uL (ref 0.1–1.0)
Monocytes Relative: 9 %
Neutro Abs: 2.1 10*3/uL (ref 1.7–7.7)
Neutrophils Relative %: 44 %
Platelet Count: 262 10*3/uL (ref 150–400)
RBC: 4.43 MIL/uL (ref 3.87–5.11)
WBC Count: 4.7 10*3/uL (ref 4.0–10.5)
nRBC: 0 % (ref 0.0–0.2)

## 2023-07-23 LAB — CMP (CANCER CENTER ONLY)
ALT: 14 U/L (ref 0–44)
AST: 18 U/L (ref 15–41)
Albumin: 3.8 g/dL (ref 3.5–5.0)
Alkaline Phosphatase: 78 U/L (ref 38–126)
Anion gap: 5 (ref 5–15)
BUN: 11 mg/dL (ref 6–20)
CO2: 26 mmol/L (ref 22–32)
Calcium: 9.3 mg/dL (ref 8.9–10.3)
Chloride: 109 mmol/L (ref 98–111)
Creatinine: 1 mg/dL (ref 0.44–1.00)
GFR, Estimated: >60 mL/min (ref >60)
Glucose, Bld: 93 mg/dL (ref 70–99)
Potassium: 4.1 mmol/L (ref 3.5–5.1)
Sodium: 140 mmol/L (ref 135–145)
Total Bilirubin: 0.3 mg/dL (ref 0.3–1.2)
Total Protein: 6.8 g/dL (ref 6.5–8.1)

## 2023-07-23 LAB — IRON AND IRON BINDING CAPACITY (CC-WL,HP ONLY)
Iron: 104 ug/dL (ref 28–170)
Saturation Ratios: 33 % — ABNORMAL HIGH (ref 10.4–31.8)
TIBC: 318 ug/dL (ref 250–450)
UIBC: 214 ug/dL (ref 148–442)

## 2023-07-23 LAB — FERRITIN: Ferritin: 115 ng/mL (ref 11–307)

## 2023-07-23 LAB — RETIC PANEL
Immature Retic Fract: 10.5 % (ref 2.3–15.9)
RBC.: 4.49 MIL/uL (ref 3.87–5.11)
Retic Count, Absolute: 57 10*3/uL (ref 19.0–186.0)
Retic Ct Pct: 1.3 % (ref 0.4–3.1)
Reticulocyte Hemoglobin: 33.5 pg (ref >27.9)

## 2023-07-26 DIAGNOSIS — M5459 Other low back pain: Secondary | ICD-10-CM | POA: Diagnosis not present

## 2023-07-28 DIAGNOSIS — M5459 Other low back pain: Secondary | ICD-10-CM | POA: Diagnosis not present

## 2023-07-30 ENCOUNTER — Inpatient Hospital Stay (HOSPITAL_BASED_OUTPATIENT_CLINIC_OR_DEPARTMENT_OTHER): Payer: BC Managed Care – PPO | Admitting: Hematology and Oncology

## 2023-07-30 VITALS — BP 127/94 | HR 70 | Temp 98.3°F | Resp 16 | Wt 198.8 lb

## 2023-07-30 DIAGNOSIS — D500 Iron deficiency anemia secondary to blood loss (chronic): Secondary | ICD-10-CM | POA: Diagnosis not present

## 2023-07-30 DIAGNOSIS — D509 Iron deficiency anemia, unspecified: Secondary | ICD-10-CM | POA: Diagnosis not present

## 2023-07-30 NOTE — Progress Notes (Unsigned)
[facility] [phone]   Fax:(336) [phone]  PROGRESS NOTE  Patient Care Team: Tamala Ser as PCP - General (Physician Assistant)  Hematological/Oncological History # Iron Deficiency Anemia, Unclear Etiology    12/16/2018: WBC 4.8, Hgb 12.6, Plt 334, MCV 92.1.   10/24/2019: WBC 6.3, Hgb 9.2, MCV 84, Plt 481, RDW 17.2%, Cr. 1.2  12/18/2019: Establish care with Dr. Leonides Schanz. WBC 4.4, Hgb 8.9, Plt 366, MCV 78.4. Iron 12, Sat 3%, Ferritin 6, TIBC 431  03/18/2020: WBC 5.4, Hgb 12.9, MCV 92.6, Plt 292. Iron 49, TIBC 371, ferritin 29, Sat 13%  06/19/2020: WBC 5.8, Hgb 10.7, MCV 93.1, Plt 342 09/02/2022: WBC 5.4, Hgb 8.8, MCV 75.1, Plt 392 10/27/2022: colonoscopy and EGD showed no clear source of bleeding.  7/23-7/30/2024: patient received 2 dose of IV feraheme iron   Interval History:  Caitlyn Page 57 y.o. female with medical history significant for iron deficiency anemia presents for a follow up visit. The patient's last visit was on 05/28/2023. In the interim since the last visit she Siv 2 doses of IV Feraheme in July.  On exam today Caitlyn Page notes she feels quite well and reports the IV iron infusions went great.  She notes that she did not have any major side effects as result of the infusion.  She lost her ice cravings and was no longer biting her nails.  She reports that she is excited for her upcoming surgery on 08/10/2023.  She is glad that her blood counts are now sufficient in order to undergo the surgery.  She is doing her best to walk and exercise though she continues to have a craving for salt.  She notes that she has not had any overt signs of bleeding, bruising, or dark stools.  Otherwise she currently denies having any issues with shortness of breath, chest pain, fevers, chills, sweats, nausea, running or diarrhea.  A full 10 point ROS is listed below.  MEDICAL HISTORY:  Past Medical History:  Diagnosis Date   Allergy    Anxiety    Arthritis     Chronic kidney disease    Depression    Frequent headaches    GERD (gastroesophageal reflux disease)    Iron deficiency anemia 12/19/2019   Legionella pneumonia (HCC) 02/09/2022   Prediabetes    Right foot pain    Sinus problem    Thyroid disease    Vision problems     SURGICAL HISTORY: Past Surgical History:  Procedure Laterality Date   sinus surgery   2000   TONSILLECTOMY AND ADENOIDECTOMY     Sometime in the 1980's    ALLERGIES:  is allergic to egg [egg-derived products], fructose, and gluten meal.  MEDICATIONS:  Current Outpatient Medications  Medication Sig Dispense Refill   NP THYROID 15 MG tablet Take 15 mg by mouth every morning. Take 3 tabs daily     nystatin (MYCOSTATIN) 500000 units TABS tablet Take 500,000 Units by mouth in the morning and at bedtime.     omeprazole (PRILOSEC) 40 MG capsule Take 40 mg by mouth daily.     No current facility-administered medications for this visit.    REVIEW OF SYSTEMS:   Constitutional: ( - ) fevers, ( - )  chills , ( - ) night sweats Eyes: ( - ) blurriness of vision, ( - ) double vision, ( - ) watery eyes Ears, nose, mouth, throat, and face: ( - ) mucositis, ( - ) sore throat Respiratory: ( - )  cough, ( - ) dyspnea, ( - ) wheezes Cardiovascular: ( - ) palpitation, ( - ) chest discomfort, ( - ) lower extremity swelling Gastrointestinal:  ( - ) nausea, ( - ) heartburn, ( - ) change in bowel habits Skin: ( - ) abnormal skin rashes Lymphatics: ( - ) new lymphadenopathy, ( - ) easy bruising Neurological: ( - ) numbness, ( - ) tingling, ( - ) new weaknesses Behavioral/Psych: ( - ) mood change, ( - ) new changes  All other systems were reviewed with the patient and are negative.  PHYSICAL EXAMINATION: ECOG PERFORMANCE STATUS: 1 - Symptomatic but completely ambulatory  Vitals:   07/30/23 0937  BP: (!) 127/94  Pulse: 70  Resp: 16  Temp: 98.3 F (36.8 C)  SpO2: 97%      Filed Weights   07/30/23 0937  Weight: 198 lb  12.8 oz (90.2 kg)    GENERAL: well appearing middle aged Caucasian female. alert, no distress and comfortable SKIN: skin color, texture, turgor are normal, no rashes or significant lesions EYES: conjunctiva are pink and non-injected, sclera clear LUNGS: clear to auscultation and percussion with normal breathing effort HEART: regular rate & rhythm and no murmurs and no lower extremity edema Musculoskeletal: no cyanosis of digits and no clubbing  PSYCH: alert & oriented x 3, fluent speech NEURO: no focal motor/sensory deficits  LABORATORY DATA:  I have reviewed the data as listed    Latest Ref Rng & Units 07/23/2023    9:11 AM 05/28/2023    9:05 AM 12/03/2022    8:20 AM  CBC  WBC 4.0 - 10.5 K/uL 4.7  4.6  5.2   Hemoglobin 12.0 - 15.0 g/dL 40.3  8.2  47.4   Hematocrit 36.0 - 46.0 % 38.2  27.2  37.6   Platelets 150 - 400 K/uL 262  414  292        Latest Ref Rng & Units 07/23/2023    9:11 AM 05/28/2023    9:05 AM 12/03/2022    8:20 AM  CMP  Glucose 70 - 99 mg/dL 93  89  95   BUN 6 - 20 mg/dL 11  12  11    Creatinine 0.44 - 1.00 mg/dL 2.59  5.63  8.75   Sodium 135 - 145 mmol/L 140  141  141   Potassium 3.5 - 5.1 mmol/L 4.1  4.2  4.0   Chloride 98 - 111 mmol/L 109  107  108   CO2 22 - 32 mmol/L 26  27  26    Calcium 8.9 - 10.3 mg/dL 9.3  9.3  9.3   Total Protein 6.5 - 8.1 g/dL 6.8  6.9  6.5   Total Bilirubin 0.3 - 1.2 mg/dL 0.3  0.3  0.3   Alkaline Phos 38 - 126 U/L 78  79  75   AST 15 - 41 U/L 18  16  18    ALT 0 - 44 U/L 14  10  16      No results found for: "MPROTEIN" Lab Results  Component Value Date   KPAFRELGTCHN 14.5 12/18/2019   LAMBDASER 13.0 12/18/2019   KAPLAMBRATIO 1.12 12/18/2019    RADIOGRAPHIC STUDIES: No results found.  ASSESSMENT & PLAN Caitlyn Page 57 y.o. female with medical history significant for iron deficiency anemia presents for a follow up visit.  After review the labs and discussion with the patient her findings are most consistent with an iron  deficiency anemia that has been adequately treated with IV iron  therapy.    In terms of the cause of iron deficiency anemia does not entirely clear at this time.  There is no evidence of inflammation, GYN bleeding, or GI bleeding based on the EGD and colonoscopy.  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  It is worth noting that the patient does not consume a lot of iron in her regular diet and this could potentially be the cause.  At this time we will reconnect the patient with GI services.  She would like not to go back to [place] and has requested referral to  GI.  #Iron Deficiency Anemia 2/2 to Unclear Etiology, like GI bleeding --today will repeat CBC, CMP and also assess reticulocyte panel and iron panel -- recommend evaluation by GI.  She has connected with Dr. Orvan Falconer at [place]. Colonoscopy/EGD performed on 10/27/2022.  -- Patient unable to tolerate ferrous sulfate 325mg  due to GI upset. --patient has had no menstrual cycles x 15 years.  --If iron levels are low in the future we will administer more IV iron therapy. No indication for IV iron today.  --labs today show white blood cell 4.7, hemoglobin 12.7, MCV 86.2, and platelets of 262.  This is marked improvement from her labs on 05/28/2023. --administered 2 doses of IV Feraheme in July 2024. --RTC in 6 months  No orders of the defined types were placed in this encounter.  All questions were answered. The patient knows to call the clinic with any problems, questions or concerns.  A total of more than 30 minutes were spent on this encounter and over half of that time was spent on counseling and coordination of care as outlined above.   Caitlyn Barns, MD Department of Hematology/Oncology [facility] Phone: [phone] Pager: [phone] Email: [email or web address]  07/31/2023  2:32 PM

## 2023-08-04 DIAGNOSIS — M5459 Other low back pain: Secondary | ICD-10-CM | POA: Diagnosis not present

## 2023-08-06 DIAGNOSIS — M5459 Other low back pain: Secondary | ICD-10-CM | POA: Diagnosis not present

## 2023-08-10 DIAGNOSIS — K449 Diaphragmatic hernia without obstruction or gangrene: Secondary | ICD-10-CM | POA: Diagnosis not present

## 2023-08-10 DIAGNOSIS — Z79890 Hormone replacement therapy (postmenopausal): Secondary | ICD-10-CM | POA: Diagnosis not present

## 2023-08-10 DIAGNOSIS — E669 Obesity, unspecified: Secondary | ICD-10-CM | POA: Diagnosis not present

## 2023-08-10 DIAGNOSIS — Z6834 Body mass index (BMI) 34.0-34.9, adult: Secondary | ICD-10-CM | POA: Diagnosis not present

## 2023-08-10 DIAGNOSIS — E039 Hypothyroidism, unspecified: Secondary | ICD-10-CM | POA: Diagnosis not present

## 2023-08-10 DIAGNOSIS — Z79899 Other long term (current) drug therapy: Secondary | ICD-10-CM | POA: Diagnosis not present

## 2023-08-11 DIAGNOSIS — Z79890 Hormone replacement therapy (postmenopausal): Secondary | ICD-10-CM | POA: Diagnosis not present

## 2023-08-11 DIAGNOSIS — Z6834 Body mass index (BMI) 34.0-34.9, adult: Secondary | ICD-10-CM | POA: Diagnosis not present

## 2023-08-11 DIAGNOSIS — E669 Obesity, unspecified: Secondary | ICD-10-CM | POA: Diagnosis not present

## 2023-08-11 DIAGNOSIS — Z79899 Other long term (current) drug therapy: Secondary | ICD-10-CM | POA: Diagnosis not present

## 2023-08-11 DIAGNOSIS — E039 Hypothyroidism, unspecified: Secondary | ICD-10-CM | POA: Diagnosis not present

## 2023-08-11 DIAGNOSIS — K449 Diaphragmatic hernia without obstruction or gangrene: Secondary | ICD-10-CM | POA: Diagnosis not present

## 2023-08-12 DIAGNOSIS — E669 Obesity, unspecified: Secondary | ICD-10-CM | POA: Diagnosis not present

## 2023-08-12 DIAGNOSIS — Z79890 Hormone replacement therapy (postmenopausal): Secondary | ICD-10-CM | POA: Diagnosis not present

## 2023-08-12 DIAGNOSIS — Z6834 Body mass index (BMI) 34.0-34.9, adult: Secondary | ICD-10-CM | POA: Diagnosis not present

## 2023-08-12 DIAGNOSIS — E039 Hypothyroidism, unspecified: Secondary | ICD-10-CM | POA: Diagnosis not present

## 2023-08-12 DIAGNOSIS — Z79899 Other long term (current) drug therapy: Secondary | ICD-10-CM | POA: Diagnosis not present

## 2023-08-12 DIAGNOSIS — K449 Diaphragmatic hernia without obstruction or gangrene: Secondary | ICD-10-CM | POA: Diagnosis not present

## 2023-08-25 DIAGNOSIS — E66811 Obesity, class 1: Secondary | ICD-10-CM | POA: Diagnosis not present

## 2023-08-25 DIAGNOSIS — Z6833 Body mass index (BMI) 33.0-33.9, adult: Secondary | ICD-10-CM | POA: Diagnosis not present

## 2023-08-25 DIAGNOSIS — K449 Diaphragmatic hernia without obstruction or gangrene: Secondary | ICD-10-CM | POA: Diagnosis not present

## 2023-09-22 DIAGNOSIS — K449 Diaphragmatic hernia without obstruction or gangrene: Secondary | ICD-10-CM | POA: Diagnosis not present

## 2023-09-22 DIAGNOSIS — E66811 Obesity, class 1: Secondary | ICD-10-CM | POA: Diagnosis not present

## 2023-09-22 DIAGNOSIS — N189 Chronic kidney disease, unspecified: Secondary | ICD-10-CM | POA: Diagnosis not present

## 2023-09-22 DIAGNOSIS — Z6833 Body mass index (BMI) 33.0-33.9, adult: Secondary | ICD-10-CM | POA: Diagnosis not present

## 2023-10-29 ENCOUNTER — Inpatient Hospital Stay: Payer: BC Managed Care – PPO | Attending: Hematology

## 2023-10-29 ENCOUNTER — Other Ambulatory Visit: Payer: Self-pay | Admitting: Hematology and Oncology

## 2023-10-29 DIAGNOSIS — D509 Iron deficiency anemia, unspecified: Secondary | ICD-10-CM | POA: Insufficient documentation

## 2023-10-29 DIAGNOSIS — D500 Iron deficiency anemia secondary to blood loss (chronic): Secondary | ICD-10-CM

## 2023-10-29 LAB — IRON AND IRON BINDING CAPACITY (CC-WL,HP ONLY)
Iron: 181 ug/dL — ABNORMAL HIGH (ref 28–170)
Saturation Ratios: 54 % — ABNORMAL HIGH (ref 10.4–31.8)
TIBC: 335 ug/dL (ref 250–450)
UIBC: 154 ug/dL (ref 148–442)

## 2023-10-29 LAB — CBC WITH DIFFERENTIAL (CANCER CENTER ONLY)
Abs Immature Granulocytes: 0 10*3/uL (ref 0.00–0.07)
Basophils Absolute: 0 10*3/uL (ref 0.0–0.1)
Basophils Relative: 1 %
Eosinophils Absolute: 0.2 10*3/uL (ref 0.0–0.5)
Eosinophils Relative: 4 %
HCT: 42.7 % (ref 36.0–46.0)
Hemoglobin: 13.9 g/dL (ref 12.0–15.0)
Immature Granulocytes: 0 %
Lymphocytes Relative: 42 %
Lymphs Abs: 2.1 10*3/uL (ref 0.7–4.0)
MCH: 30.8 pg (ref 26.0–34.0)
MCHC: 32.6 g/dL (ref 30.0–36.0)
MCV: 94.5 fL (ref 80.0–100.0)
Monocytes Absolute: 0.3 10*3/uL (ref 0.1–1.0)
Monocytes Relative: 7 %
Neutro Abs: 2.3 10*3/uL (ref 1.7–7.7)
Neutrophils Relative %: 46 %
Platelet Count: 266 10*3/uL (ref 150–400)
RBC: 4.52 MIL/uL (ref 3.87–5.11)
RDW: 13.3 % (ref 11.5–15.5)
WBC Count: 4.9 10*3/uL (ref 4.0–10.5)
nRBC: 0 % (ref 0.0–0.2)

## 2023-10-29 LAB — CMP (CANCER CENTER ONLY)
ALT: 14 U/L (ref 0–44)
AST: 19 U/L (ref 15–41)
Albumin: 4 g/dL (ref 3.5–5.0)
Alkaline Phosphatase: 90 U/L (ref 38–126)
Anion gap: 4 — ABNORMAL LOW (ref 5–15)
BUN: 12 mg/dL (ref 6–20)
CO2: 30 mmol/L (ref 22–32)
Calcium: 9.5 mg/dL (ref 8.9–10.3)
Chloride: 107 mmol/L (ref 98–111)
Creatinine: 1.02 mg/dL — ABNORMAL HIGH (ref 0.44–1.00)
GFR, Estimated: >60 mL/min (ref >60)
Glucose, Bld: 94 mg/dL (ref 70–99)
Potassium: 4.1 mmol/L (ref 3.5–5.1)
Sodium: 141 mmol/L (ref 135–145)
Total Bilirubin: 0.6 mg/dL (ref <1.2)
Total Protein: 7.1 g/dL (ref 6.5–8.1)

## 2023-10-29 LAB — RETIC PANEL
Immature Retic Fract: 11.1 % (ref 2.3–15.9)
RBC.: 4.4 MIL/uL (ref 3.87–5.11)
Retic Count, Absolute: 52.8 10*3/uL (ref 19.0–186.0)
Retic Ct Pct: 1.2 % (ref 0.4–3.1)
Reticulocyte Hemoglobin: 35.5 pg (ref >27.9)

## 2023-10-29 LAB — FERRITIN: Ferritin: 81 ng/mL (ref 11–307)

## 2023-11-04 ENCOUNTER — Telehealth: Payer: Self-pay | Admitting: *Deleted

## 2023-11-04 NOTE — Telephone Encounter (Signed)
Received call from pt inquiring about her recent lab results. Reviewed results with her. Advised that her iron stores have come up and are staying in a good range at this point. Pt was concerned that her iron was too high. Advised that one level was a bit over high normal but as she has iron deficiency as a baseline, it is ok to have a bit extra iron on board. Pt voiced understanding.

## 2024-01-20 ENCOUNTER — Other Ambulatory Visit: Payer: Self-pay | Admitting: Physician Assistant

## 2024-01-20 DIAGNOSIS — D500 Iron deficiency anemia secondary to blood loss (chronic): Secondary | ICD-10-CM

## 2024-01-21 ENCOUNTER — Inpatient Hospital Stay: Payer: BLUE CROSS/BLUE SHIELD | Attending: Hematology

## 2024-01-27 ENCOUNTER — Telehealth: Payer: Self-pay | Admitting: Hematology and Oncology

## 2024-01-28 ENCOUNTER — Inpatient Hospital Stay: Payer: BLUE CROSS/BLUE SHIELD | Admitting: Hematology and Oncology
